# Patient Record
Sex: Male | Born: 1975 | Hispanic: No | Marital: Married | Smoking: Never smoker
Health system: Southern US, Community
[De-identification: ages and names within clinical notes are randomized; demographics above are authoritative.]

## PROBLEM LIST (undated history)

## (undated) DIAGNOSIS — I8390 Asymptomatic varicose veins of unspecified lower extremity: Secondary | ICD-10-CM

---

## 2019-07-08 ENCOUNTER — Inpatient Hospital Stay
Admission: EM | Admit: 2019-07-08 | Discharge: 2019-07-10 | DRG: 177 | Disposition: A | Discharge status: Other Acute Inpatient Hospital | Payer: HRSA Program | Attending: Internal Medicine | Admitting: Internal Medicine

## 2019-07-08 ENCOUNTER — Encounter: Payer: Self-pay | Admitting: Emergency Medicine

## 2019-07-08 ENCOUNTER — Other Ambulatory Visit: Payer: Self-pay

## 2019-07-08 ENCOUNTER — Emergency Department: Payer: HRSA Program

## 2019-07-08 DIAGNOSIS — J9601 Acute respiratory failure with hypoxia: Secondary | ICD-10-CM | POA: Diagnosis present

## 2019-07-08 DIAGNOSIS — F17200 Nicotine dependence, unspecified, uncomplicated: Secondary | ICD-10-CM | POA: Diagnosis present

## 2019-07-08 DIAGNOSIS — J069 Acute upper respiratory infection, unspecified: Secondary | ICD-10-CM

## 2019-07-08 DIAGNOSIS — R112 Nausea with vomiting, unspecified: Secondary | ICD-10-CM

## 2019-07-08 DIAGNOSIS — A419 Sepsis, unspecified organism: Secondary | ICD-10-CM

## 2019-07-08 DIAGNOSIS — E876 Hypokalemia: Secondary | ICD-10-CM | POA: Diagnosis present

## 2019-07-08 DIAGNOSIS — U071 COVID-19: Secondary | ICD-10-CM | POA: Diagnosis present

## 2019-07-08 DIAGNOSIS — J1282 Pneumonia due to coronavirus disease 2019: Secondary | ICD-10-CM

## 2019-07-08 DIAGNOSIS — J1289 Other viral pneumonia: Secondary | ICD-10-CM | POA: Diagnosis present

## 2019-07-08 DIAGNOSIS — Z833 Family history of diabetes mellitus: Secondary | ICD-10-CM

## 2019-07-08 HISTORY — DX: Asymptomatic varicose veins of unspecified lower extremity: I83.90

## 2019-07-08 LAB — COMPREHENSIVE METABOLIC PANEL
ALT: 52 U/L — ABNORMAL HIGH (ref 0–44)
AST: 52 U/L — ABNORMAL HIGH (ref 15–41)
Albumin: 3.3 g/dL — ABNORMAL LOW (ref 3.5–5.0)
Alkaline Phosphatase: 48 U/L (ref 38–126)
Anion gap: 9 (ref 5–15)
BUN: 11 mg/dL (ref 6–20)
CO2: 23 mmol/L (ref 22–32)
Calcium: 8.2 mg/dL — ABNORMAL LOW (ref 8.9–10.3)
Chloride: 105 mmol/L (ref 98–111)
Creatinine, Ser: 0.77 mg/dL (ref 0.61–1.24)
GFR calc Af Amer: >60 mL/min (ref >60)
GFR calc non Af Amer: >60 mL/min (ref >60)
Glucose, Bld: 136 mg/dL — ABNORMAL HIGH (ref 70–99)
Potassium: 3.4 mmol/L — ABNORMAL LOW (ref 3.5–5.1)
Sodium: 137 mmol/L (ref 135–145)
Total Bilirubin: 0.7 mg/dL (ref 0.3–1.2)
Total Protein: 7.7 g/dL (ref 6.5–8.1)

## 2019-07-08 LAB — BRAIN NATRIURETIC PEPTIDE: B Natriuretic Peptide: 25 pg/mL (ref 0.0–100.0)

## 2019-07-08 LAB — CBC WITH DIFFERENTIAL/PLATELET
Abs Immature Granulocytes: 0.03 10*3/uL (ref 0.00–0.07)
Basophils Absolute: 0 10*3/uL (ref 0.0–0.1)
Basophils Relative: 0 %
Eosinophils Absolute: 0 10*3/uL (ref 0.0–0.5)
Eosinophils Relative: 0 %
HCT: 43.1 % (ref 39.0–52.0)
Hemoglobin: 14.2 g/dL (ref 13.0–17.0)
Immature Granulocytes: 1 %
Lymphocytes Relative: 23 %
Lymphs Abs: 1.4 10*3/uL (ref 0.7–4.0)
MCH: 27.6 pg (ref 26.0–34.0)
MCHC: 32.9 g/dL (ref 30.0–36.0)
MCV: 83.9 fL (ref 80.0–100.0)
Monocytes Absolute: 0.3 10*3/uL (ref 0.1–1.0)
Monocytes Relative: 5 %
Neutro Abs: 4.4 10*3/uL (ref 1.7–7.7)
Neutrophils Relative %: 71 %
Platelets: 184 10*3/uL (ref 150–400)
RBC: 5.14 MIL/uL (ref 4.22–5.81)
RDW: 14.3 % (ref 11.5–15.5)
WBC: 6.1 10*3/uL (ref 4.0–10.5)
nRBC: 0 % (ref 0.0–0.2)

## 2019-07-08 LAB — TROPONIN I (HIGH SENSITIVITY): Troponin I (High Sensitivity): 6 ng/L (ref <18)

## 2019-07-08 LAB — PROCALCITONIN: Procalcitonin: <0.1 ng/mL

## 2019-07-08 LAB — FIBRINOGEN: Fibrinogen: 554 mg/dL — ABNORMAL HIGH (ref 210–475)

## 2019-07-08 LAB — LACTIC ACID, PLASMA: Lactic Acid, Venous: 1.4 mmol/L (ref 0.5–1.9)

## 2019-07-08 LAB — TYPE AND SCREEN
ABO/RH(D): O POS
Antibody Screen: NEGATIVE

## 2019-07-08 LAB — LIPASE, BLOOD: Lipase: 38 U/L (ref 11–51)

## 2019-07-08 LAB — ABO/RH: ABO/RH(D): O POS

## 2019-07-08 LAB — C-REACTIVE PROTEIN: CRP: 7.7 mg/dL — ABNORMAL HIGH (ref <1.0)

## 2019-07-08 LAB — MAGNESIUM: Magnesium: 2.1 mg/dL (ref 1.7–2.4)

## 2019-07-08 LAB — FIBRIN DERIVATIVES D-DIMER (ARMC ONLY): Fibrin derivatives D-dimer (ARMC): 1575.3 ng/mL (FEU) — ABNORMAL HIGH (ref 0.00–499.00)

## 2019-07-08 LAB — FERRITIN: Ferritin: 558 ng/mL — ABNORMAL HIGH (ref 24–336)

## 2019-07-08 LAB — LACTATE DEHYDROGENASE: LDH: 285 U/L — ABNORMAL HIGH (ref 98–192)

## 2019-07-08 MED ORDER — GUAIFENESIN ER 600 MG PO TB12
600.0000 mg | ORAL_TABLET | Freq: Two times a day (BID) | ORAL | Status: DC
  Administered 2019-07-08 – 2019-07-10 (×4): 600 mg via ORAL
  Filled 2019-07-08 (×4): qty 1

## 2019-07-08 MED ORDER — VITAMIN C 500 MG PO TABS
500.0000 mg | ORAL_TABLET | Freq: Every day | ORAL | Status: DC
  Administered 2019-07-08 – 2019-07-10 (×3): 500 mg via ORAL
  Filled 2019-07-08 (×3): qty 1

## 2019-07-08 MED ORDER — DEXAMETHASONE SODIUM PHOSPHATE 10 MG/ML IJ SOLN
10.0000 mg | Freq: Once | INTRAMUSCULAR | Status: AC
  Administered 2019-07-08: 10 mg via INTRAVENOUS
  Filled 2019-07-08: qty 1

## 2019-07-08 MED ORDER — DM-GUAIFENESIN ER 30-600 MG PO TB12: 1.0000 | ORAL_TABLET | Freq: Two times a day (BID) | ORAL | Status: DC

## 2019-07-08 MED ORDER — ONDANSETRON HCL 4 MG/2ML IJ SOLN: 4.0000 mg | Freq: Four times a day (QID) | INTRAMUSCULAR | Status: DC | PRN

## 2019-07-08 MED ORDER — SODIUM CHLORIDE 0.9 % IV SOLN
100.0000 mg | Freq: Every day | INTRAVENOUS | Status: DC
  Administered 2019-07-09 – 2019-07-10 (×2): 100 mg via INTRAVENOUS
  Filled 2019-07-08: qty 20
  Filled 2019-07-08 (×2): qty 100

## 2019-07-08 MED ORDER — ALBUTEROL SULFATE HFA 108 (90 BASE) MCG/ACT IN AERS
2.0000 | INHALATION_SPRAY | Freq: Four times a day (QID) | RESPIRATORY_TRACT | Status: DC | PRN
  Filled 2019-07-08: qty 6.7

## 2019-07-08 MED ORDER — LACTATED RINGERS IV BOLUS
1000.0000 mL | Freq: Once | INTRAVENOUS | Status: AC
  Administered 2019-07-08: 1000 mL via INTRAVENOUS

## 2019-07-08 MED ORDER — METHYLPREDNISOLONE SODIUM SUCC 40 MG IJ SOLR
40.0000 mg | Freq: Two times a day (BID) | INTRAMUSCULAR | Status: DC
  Administered 2019-07-09 – 2019-07-10 (×3): 40 mg via INTRAVENOUS
  Filled 2019-07-08 (×4): qty 1

## 2019-07-08 MED ORDER — SODIUM CHLORIDE 0.9 % IV SOLN
200.0000 mg | Freq: Once | INTRAVENOUS | Status: AC
  Administered 2019-07-08: 200 mg via INTRAVENOUS
  Filled 2019-07-08: qty 200

## 2019-07-08 MED ORDER — INFLUENZA VAC SPLIT QUAD 0.5 ML IM SUSY: 0.5000 mL | PREFILLED_SYRINGE | INTRAMUSCULAR | Status: DC

## 2019-07-08 MED ORDER — ACETAMINOPHEN 325 MG PO TABS: 650.0000 mg | ORAL_TABLET | Freq: Four times a day (QID) | ORAL | Status: DC | PRN

## 2019-07-08 MED ORDER — SODIUM CHLORIDE 0.9 % IV SOLN
INTRAVENOUS | Status: DC
  Administered 2019-07-08 – 2019-07-09 (×3): via INTRAVENOUS

## 2019-07-08 MED ORDER — ACETAMINOPHEN 500 MG PO TABS
1000.0000 mg | ORAL_TABLET | Freq: Once | ORAL | Status: AC
  Administered 2019-07-08: 1000 mg via ORAL
  Filled 2019-07-08: qty 2

## 2019-07-08 MED ORDER — ONDANSETRON HCL 4 MG/2ML IJ SOLN
4.0000 mg | Freq: Once | INTRAMUSCULAR | Status: AC
  Administered 2019-07-08: 4 mg via INTRAVENOUS
  Filled 2019-07-08: qty 2

## 2019-07-08 MED ORDER — POTASSIUM CHLORIDE CRYS ER 20 MEQ PO TBCR
20.0000 meq | EXTENDED_RELEASE_TABLET | Freq: Once | ORAL | Status: AC
  Administered 2019-07-08: 20 meq via ORAL
  Filled 2019-07-08: qty 1

## 2019-07-08 MED ORDER — DEXTROMETHORPHAN POLISTIREX ER 30 MG/5ML PO SUER
30.0000 mg | Freq: Two times a day (BID) | ORAL | Status: DC
  Administered 2019-07-08 – 2019-07-10 (×4): 30 mg via ORAL
  Filled 2019-07-08 (×6): qty 5

## 2019-07-08 MED ORDER — LEVALBUTEROL TARTRATE 45 MCG/ACT IN AERO
2.0000 | INHALATION_SPRAY | Freq: Four times a day (QID) | RESPIRATORY_TRACT | Status: DC | PRN
  Filled 2019-07-08 (×2): qty 15

## 2019-07-08 MED ORDER — ZINC SULFATE 220 (50 ZN) MG PO CAPS
220.0000 mg | ORAL_CAPSULE | Freq: Every day | ORAL | Status: DC
  Administered 2019-07-08 – 2019-07-10 (×3): 220 mg via ORAL
  Filled 2019-07-08 (×3): qty 1

## 2019-07-08 MED ORDER — ENOXAPARIN SODIUM 40 MG/0.4ML ~~LOC~~ SOLN
40.0000 mg | SUBCUTANEOUS | Status: DC
  Administered 2019-07-08 – 2019-07-09 (×2): 40 mg via SUBCUTANEOUS
  Filled 2019-07-08 (×2): qty 0.4

## 2019-07-08 MED ORDER — ONDANSETRON HCL 4 MG PO TABS: 4.0000 mg | ORAL_TABLET | Freq: Four times a day (QID) | ORAL | Status: DC | PRN

## 2019-07-08 MED ORDER — ALBUTEROL SULFATE HFA 108 (90 BASE) MCG/ACT IN AERS
2.0000 | INHALATION_SPRAY | RESPIRATORY_TRACT | Status: DC | PRN
  Administered 2019-07-08: 2 via RESPIRATORY_TRACT
  Filled 2019-07-08: qty 6.7

## 2019-07-08 MED ORDER — OXYCODONE-ACETAMINOPHEN 5-325 MG PO TABS
1.0000 | ORAL_TABLET | ORAL | Status: DC | PRN
  Administered 2019-07-08 – 2019-07-10 (×3): 1 via ORAL
  Filled 2019-07-08 (×4): qty 1

## 2019-07-08 NOTE — Progress Notes (Signed)
   07/08/19 1945  Clinical Encounter Type  Visited With Patient  Visit Type Initial  Referral From Nurse  Consult/Referral To Chaplain  Spiritual Encounters  Spiritual Needs Other (Comment)  [place] called patient due to patient being under Covid-19 precautions. Pt declined pastoral care. No further needs at this time.

## 2019-07-08 NOTE — H&P (Signed)
History and Physical    Jacob Gardner TDV:[id number] DOB: 11/21/1975 DOA: 07/08/2019  Referring MD/NP/PA:   PCP: Patient, No Pcp Per   Patient coming from:  The patient is coming from home.  At baseline, pt is independent for most of ADL.        Chief Complaint: SOB  HPI: Jacob Gardner is a 43 y.o. male without significant medical history who present with SOB.  Patient reports that he initially started feeling bad about 1 week ago with fevers, nausea, cough, chest pain, and shortness of breath.  He subsequently tested positive for COVID-19 3 days ago at the [facility] emergency room.  He states his symptoms have progressively worsened since then with increasing difficulty breathing as well as ongoing central sharp pleuritic chest pain. He also has fever and chills. He also states that he has been having nausea, vomiting, poor appetite in the past several days. He denies any associated abdominal pain, constipation, or diarrhea.  ED Course: pt was found to have WBC 6.1, trop 6, lactic acid 1.4, potassium 3.4, creatinine normal, temperature 101, tachycardia, tachypnea, oxygen saturation 88% on room air, which improved to 95% on 4 L oxygen.  Checks x-ray showed ground glass infiltration bilaterally.  Patient is admitted to [place] bed as inpatient.  Review of Systems:   General: has fevers, chills, no body weight gain, has poor appetite, has fatigue HEENT: no blurry vision, hearing changes or sore throat Respiratory: has dyspnea, coughing, no wheezing CV: no chest pain, no palpitations GI: has nausea, vomiting, no abdominal pain, diarrhea, constipation GU: no dysuria, burning on urination, increased urinary frequency, hematuria  Ext: no leg edema Neuro: no unilateral weakness, numbness, or tingling, no vision change or hearing loss Skin: no rash, no skin tear. MSK: No muscle spasm, no deformity, no limitation of range of movement in spin Heme: No easy bruising.  Travel history: No recent  long distant travel.  Allergy: No Known Allergies  Past Medical History:  Diagnosis Date  . Varicose vein of leg     History reviewed. No pertinent surgical history.  Social History:  reports that he has been smoking. He uses smokeless tobacco. He reports that he does not drink alcohol or use drugs.  Family History:  Family History  Problem Relation Age of Onset  . Diabetes Mellitus II Mother      Prior to Admission medications   Not on File    Physical Exam: Vitals:   07/08/19 1530 07/08/19 1600 07/08/19 1700 07/08/19 1702  BP:  104/66  104/67  Pulse: 86 80  73  Resp: (!) 24 (!) 21  19  Temp:    98.7 F (37.1 C)  TempSrc:    Oral  SpO2: 96% 95%  95%  Weight:   114.6 kg   Height:   5\' 5"  (1.651 m)    General: Not in acute distress HEENT:       Eyes: PERRL, EOMI, no scleral icterus.       ENT: No discharge from the ears and nose, no pharynx injection, no tonsillar enlargement.        Neck: No JVD, no bruit, no mass felt. Heme: No neck lymph node enlargement. Cardiac: S1/S2, RRR, No murmurs, No gallops or rubs. Respiratory: o rales, wheezing, rhonchi or rubs. GI: Soft, nondistended, nontender, no rebound pain, no organomegaly, BS present. GU: No hematuria Ext: No pitting leg edema bilaterally. 2+DP/PT pulse bilaterally. Musculoskeletal: No joint deformities, No joint redness or warmth, no limitation  of ROM in spin. Skin: No rashes.  Neuro: Alert, oriented X3, cranial nerves II-XII grossly intact, moves all extremities normally.  Psych: Patient is not psychotic, no suicidal or hemocidal ideation.  Labs on Admission: I have personally reviewed following labs and imaging studies  CBC: Recent Labs  Lab 07/08/19 1146  WBC 6.1  NEUTROABS 4.4  HGB 14.2  HCT 43.1  MCV 83.9  PLT 184   Basic Metabolic Panel: Recent Labs  Lab 07/08/19 1146  NA 137  K 3.4*  CL 105  CO2 23  GLUCOSE 136*  BUN 11  CREATININE 0.77  CALCIUM 8.2*  MG 2.1   GFR: Estimated  Creatinine Clearance: 140.7 mL/min (by C-G formula based on SCr of 0.77 mg/dL). Liver Function Tests: Recent Labs  Lab 07/08/19 1146  AST 52*  ALT 52*  ALKPHOS 48  BILITOT 0.7  PROT 7.7  ALBUMIN 3.3*   Recent Labs  Lab 07/08/19 1146  LIPASE 38   No results for input(s): AMMONIA in the last 168 hours. Coagulation Profile: No results for input(s): INR, PROTIME in the last 168 hours. Cardiac Enzymes: No results for input(s): CKTOTAL, CKMB, CKMBINDEX, TROPONINI in the last 168 hours. BNP (last 3 results) No results for input(s): PROBNP in the last 8760 hours. HbA1C: No results for input(s): HGBA1C in the last 72 hours. CBG: No results for input(s): GLUCAP in the last 168 hours. Lipid Profile: No results for input(s): CHOL, HDL, LDLCALC, TRIG, CHOLHDL, LDLDIRECT in the last 72 hours. Thyroid Function Tests: No results for input(s): TSH, T4TOTAL, FREET4, T3FREE, THYROIDAB in the last 72 hours. Anemia Panel: Recent Labs    07/08/19 1146  FERRITIN 558*   Urine analysis: No results found for: COLORURINE, APPEARANCEUR, LABSPEC, PHURINE, GLUCOSEU, HGBUR, BILIRUBINUR, KETONESUR, PROTEINUR, UROBILINOGEN, NITRITE, LEUKOCYTESUR Sepsis Labs: @LABRCNTIP (procalcitonin:4,lacticidven:4) )No results found for this or any previous visit (from the past 240 hour(s)).   Radiological Exams on Admission: Dg Chest Portable 1 View  Result Date: 07/08/2019 CLINICAL DATA:  Increasing shortness of breath positive COVID test last Wednesday EXAM: PORTABLE CHEST 1 VIEW COMPARISON:  None FINDINGS: Cardiomediastinal contours are normal. Lungs with nodular and ground-glass opacities with basilar predominance. No signs of dense consolidation or evidence of pleural effusion. IMPRESSION: Findings of atypical or viral pneumonia, could certainly be seen in the setting of COVID-19 infection. No dense consolidation or pleural effusion. Electronically Signed   By: Donzetta KohutGeoffrey  Wile M.D.   On: 07/08/2019 13:12      EKG: Independently reviewed.  Sinus rhythm, QTC 425, early R wave progression  Assessment/Plan Principal Problem:   Acute respiratory disease due to COVID-19 virus Active Problems:   Hypokalemia   Acute respiratory failure with hypoxia (HCC)   Acute respiratory failure with hypoxia due to acute respiratory disease due to COVID-19 virus: -will admit to med-surg  as inpt -Remdesivir per pharm -Solumedrol 40 mg bid -vitamin C, zinc.   -prn albuterol inhaler -PRN Mucinex for cough -f/u Blood culture -Gentle IV fluid:  -D-dimer, BNP,Trop, LFT, CRP, LDH, Procalcitonin, ferritin, fibinogen, TG, Hep B SAg, HIV ab -Daily CRP, Ferritin, D-dimer, -Will ask the patient to maintain an awake prone position for 16+ hours a day, if possible, with a minimum of 2-3 hours at a time -Will attempt to maintain euvolemia to a net negative fluid status -IF patient deteriorates, will consult PCCM and ID   Hypokalemia: K= 3.4 on admission. - Repleted - Check Mg level    Inpatient status:  # Patient requires inpatient status due  to high intensity of service, high risk for further deterioration and high frequency of surveillance required.  I certify that at the point of admission it is my clinical judgment that the patient will require inpatient hospital care spanning beyond 2 midnights from the point of admission.  . Now patient has presenting with Acute respiratory disease due to COVID-19 virus and acute respiratory failure with hypoxia . The initial radiographic and laboratory data are worrisome because of bilateral groundglass infiltration on chest x-ray . Current medical needs: please see my assessment and plan . Predictability of an adverse outcome (risk): Patient presents with acute respiratory failure with hypoxia due to acute respiratory disease secondary to COVID-19 virus infection.  His presentation is highly complicated. Pt is at high risk for deteriorating.  Need to be treated in hospital  for at least 2 days.    DVT ppx: SQ Lovenox Code Status: Full code Family Communication: None at bed side.      Disposition Plan:  Anticipate discharge back to previous home environment Consults called:  none Admission status: med-surg as inpt    Date of Service 07/08/2019    Lorretta Harp [facility]   If 7PM-7AM, please contact night-coverage [email or web address] Password [email or web address] 07/08/2019, 7:21 PM

## 2019-07-08 NOTE — Treatment Plan (Signed)
Called by Dr. Charna Archer regarding Jacob Gardner.  COVID pneumonia currently requiring 4 L.  [facility] currently accepting transfers only for patients requiring more than 4 L, so at this time, he'll need to be admitted at [facility].  Please let us know if his O2 needs increase.

## 2019-07-08 NOTE — ED Triage Notes (Signed)
Pt arrived from home via [facility]. Pt c/o increasing SOB, pt received positive COVID test this past Wednesday. Pt arrived on room air.

## 2019-07-08 NOTE — ED Provider Notes (Signed)
[facility] Emergency Department Provider Note   ____________________________________________   First MD Initiated Contact with Patient 07/08/19 1125     (approximate)  I have reviewed the triage vital signs and the nursing notes.   HISTORY  Chief Complaint Shortness of Breath    HPI Jacob Gardner is a 43 y.o. male with no significant past medical history who presents to the ED complaining of shortness of breath and vomiting.  Patient reports that he initially started feeling bad about 1 week ago with fevers, nausea, cough, chest pain, and shortness of breath.  He subsequently tested positive for COVID-19 3 days ago at the [facility] emergency room.  He states his symptoms have progressively worsened since then with increasing difficulty breathing as well as ongoing central sharp pleuritic chest pain.  He also states that he has been vomiting for the past few days, has been unable to keep down liquids or solids.  He denies any associated abdominal pain, constipation, or diarrhea.        Past Medical History:  Diagnosis Date  . Varicose vein of leg     Patient Active Problem List   Diagnosis Date Noted  . Acute respiratory disease due to COVID-19 virus 07/08/2019  . Sepsis ([place]) 07/08/2019  . Hypokalemia 07/08/2019    History reviewed. No pertinent surgical history.  Prior to Admission medications   Not on File    Allergies Patient has no allergy information on record.  No family history on file.  Social History Social History   Tobacco Use  . Smoking status: Not on file  Substance Use Topics  . Alcohol use: Not on file  . Drug use: Not on file    Review of Systems  Constitutional: No for fever/chills Eyes: No visual changes. ENT: No sore throat. Cardiovascular: Positive for chest pain. Respiratory: Positive for cough and shortness of breath. Gastrointestinal: No abdominal pain.  Positive for nausea and vomiting.  No  diarrhea.  No constipation. Genitourinary: Negative for dysuria. Musculoskeletal: Negative for back pain. Skin: Negative for rash. Neurological: Negative for headaches, focal weakness or numbness.  ____________________________________________   PHYSICAL EXAM:  VITAL SIGNS: ED Triage Vitals [07/08/19 1123]  Enc Vitals Group     BP      Pulse      Resp      Temp      Temp src      SpO2 (!) 89 %     Weight      Height      Head Circumference      Peak Flow      Pain Score      Pain Loc      Pain Edu?      Excl. in [place]?     Constitutional: Alert and oriented. Eyes: Conjunctivae are normal. Head: Atraumatic. Nose: No congestion/rhinnorhea. Mouth/Throat: Mucous membranes are moist. Neck: Normal ROM Cardiovascular: Normal rate, regular rhythm. Grossly normal heart sounds. Respiratory: Tachypneic and in moderate respiratory distress.  No retractions.  Diffuse crackles throughout. Gastrointestinal: Soft and nontender. No distention. Genitourinary: deferred Musculoskeletal: No lower extremity tenderness nor edema. Neurologic:  Normal speech and language. No gross focal neurologic deficits are appreciated. Skin:  Skin is warm, dry and intact. No rash noted. Psychiatric: Mood and affect are normal. Speech and behavior are normal.  ____________________________________________   LABS (all labs ordered are listed, but only abnormal results are displayed)  Labs Reviewed  COMPREHENSIVE METABOLIC PANEL - Abnormal; Notable for  the following components:      Result Value   Potassium 3.4 (*)    Glucose, Bld 136 (*)    Calcium 8.2 (*)    Albumin 3.3 (*)    AST 52 (*)    ALT 52 (*)    All other components within normal limits  CBC WITH DIFFERENTIAL/PLATELET  LIPASE, BLOOD  TROPONIN I (HIGH SENSITIVITY)   ____________________________________________  EKG  ED ECG REPORT I, Chesley Noon, the attending physician, personally viewed and interpreted this ECG.   Date:  07/08/2019  EKG Time: 14:28  Rate: 88  Rhythm: normal sinus rhythm  Axis: Normal  Intervals:none  ST&T Change: None   PROCEDURES  Procedure(s) performed (including Critical Care):  .Critical Care Performed by: Chesley Noon, MD Authorized by: Chesley Noon, MD   Critical care provider statement:    Critical care time (minutes):  45   Critical care time was exclusive of:  Separately billable procedures and treating other patients and teaching time   Critical care was necessary to treat or prevent imminent or life-threatening deterioration of the following conditions:  Respiratory failure   Critical care was time spent personally by me on the following activities:  Discussions with consultants, evaluation of patient's response to treatment, examination of patient, ordering and performing treatments and interventions, ordering and review of laboratory studies, ordering and review of radiographic studies, pulse oximetry, re-evaluation of patient's condition, obtaining history from patient or surrogate and review of old charts   I assumed direction of critical care for this patient from another provider in my specialty: no       ____________________________________________   INITIAL IMPRESSION / ASSESSMENT AND PLAN / ED COURSE       43 year old male with recent diagnosis of COVID-19 3 days prior presents to the ED with worsening shortness of breath as well as vomiting with inability to tolerate p.o.  He is in moderate respiratory distress upon arrival, noted to be tachypneic with increased work of breathing.  He was also hypoxic in the 80s on room air, subsequently placed on 4 L nasal cannula with improvement.  Suspect his breathing difficulties are secondary to COVID-19, will give dose of steroids.  He has had difficulty tolerating p.o. but has no focal tenderness on his abdominal exam.  Will hydrate with IV fluids and give dose of Zofran, labs pending.  Anticipate transfer to [facility] for further management of COVID-19.  Chest x-ray appears consistent with COVID-19.  Patient is feeling better following Zofran, fluid bolus, and steroids.  The remainder of his labs are unremarkable, EKG without acute ischemic changes.  Case was discussed with Dr. Lowell Guitar at [facility], who states that space is only available for patients requiring greater than 4 L by nasal cannula.  Case discussed with hospitalist here at [facility], who accepted patient for admission.      ____________________________________________   FINAL CLINICAL IMPRESSION(S) / ED DIAGNOSES  Final diagnoses:  Pneumonia due to COVID-19 virus  Acute respiratory failure with hypoxia (HCC)  Non-intractable vomiting with nausea, unspecified vomiting type     ED Discharge Orders    None       Note:  This document was prepared using Dragon voice recognition software and may include unintentional dictation errors.   Chesley Noon, MD 07/08/19 1436

## 2019-07-08 NOTE — Progress Notes (Signed)
Remdesivir - Pharmacy Brief Note   O:  ALT: 52 CXR: atypical vs viral pneumonia SpO2: 88% on RA   A/P:  Remdesivir 200 mg IVPB once followed by 100 mg IVPB daily x 4 days.   .me 07/08/2019 2:21 PM

## 2019-07-08 NOTE — Plan of Care (Signed)
Pt admitted to in-patient status for symptomatic COVID-19 +.  Receiving oxygen 4L/min via [place] with SpO2 95%.   Problem: Education: Goal: Knowledge of General Education information will improve Description: Including pain rating scale, medication(s)/side effects and non-pharmacologic comfort measures Outcome: Progressing   Problem: Health Behavior/Discharge Planning: Goal: Ability to manage health-related needs will improve Outcome: Progressing   Problem: Clinical Measurements: Goal: Ability to maintain clinical measurements within normal limits will improve Outcome: Progressing Goal: Will remain free from infection Outcome: Progressing Goal: Diagnostic test results will improve Outcome: Progressing Goal: Respiratory complications will improve Outcome: Progressing Goal: Cardiovascular complication will be avoided Outcome: Progressing   Problem: Activity: Goal: Risk for activity intolerance will decrease Outcome: Progressing   Problem: Nutrition: Goal: Adequate nutrition will be maintained Outcome: Progressing   Problem: Coping: Goal: Level of anxiety will decrease Outcome: Progressing   Problem: Elimination: Goal: Will not experience complications related to bowel motility Outcome: Progressing Goal: Will not experience complications related to urinary retention Outcome: Progressing   Problem: Pain Managment: Goal: General experience of comfort will improve Outcome: Progressing   Problem: Safety: Goal: Ability to remain free from injury will improve Outcome: Progressing   Problem: Skin Integrity: Goal: Risk for impaired skin integrity will decrease Outcome: Progressing   Problem: Education: Goal: Knowledge of risk factors and measures for prevention of condition will improve Outcome: Progressing   Problem: Coping: Goal: Psychosocial and spiritual needs will be supported Outcome: Progressing   Problem: Respiratory: Goal: Will maintain a patent  airway Outcome: Progressing Goal: Complications related to the disease process, condition or treatment will be avoided or minimized Outcome: Progressing

## 2019-07-09 DIAGNOSIS — J9601 Acute respiratory failure with hypoxia: Secondary | ICD-10-CM

## 2019-07-09 LAB — CBC WITH DIFFERENTIAL/PLATELET
Abs Immature Granulocytes: 0.01 10*3/uL (ref 0.00–0.07)
Basophils Absolute: 0 10*3/uL (ref 0.0–0.1)
Basophils Relative: 0 %
Eosinophils Absolute: 0 10*3/uL (ref 0.0–0.5)
Eosinophils Relative: 0 %
HCT: 39.4 % (ref 39.0–52.0)
Hemoglobin: 13.3 g/dL (ref 13.0–17.0)
Immature Granulocytes: 0 %
Lymphocytes Relative: 20 %
Lymphs Abs: 0.8 10*3/uL (ref 0.7–4.0)
MCH: 27.4 pg (ref 26.0–34.0)
MCHC: 33.8 g/dL (ref 30.0–36.0)
MCV: 81.1 fL (ref 80.0–100.0)
Monocytes Absolute: 0.1 10*3/uL (ref 0.1–1.0)
Monocytes Relative: 3 %
Neutro Abs: 3 10*3/uL (ref 1.7–7.7)
Neutrophils Relative %: 77 %
Platelets: 184 10*3/uL (ref 150–400)
RBC: 4.86 MIL/uL (ref 4.22–5.81)
RDW: 14.3 % (ref 11.5–15.5)
Smear Review: NORMAL
WBC Morphology: ABNORMAL
WBC: 3.9 10*3/uL — ABNORMAL LOW (ref 4.0–10.5)
nRBC: 0 % (ref 0.0–0.2)

## 2019-07-09 LAB — COMPREHENSIVE METABOLIC PANEL
ALT: 55 U/L — ABNORMAL HIGH (ref 0–44)
AST: 45 U/L — ABNORMAL HIGH (ref 15–41)
Albumin: 2.9 g/dL — ABNORMAL LOW (ref 3.5–5.0)
Alkaline Phosphatase: 44 U/L (ref 38–126)
Anion gap: 8 (ref 5–15)
BUN: 10 mg/dL (ref 6–20)
CO2: 22 mmol/L (ref 22–32)
Calcium: 8.1 mg/dL — ABNORMAL LOW (ref 8.9–10.3)
Chloride: 108 mmol/L (ref 98–111)
Creatinine, Ser: 0.53 mg/dL — ABNORMAL LOW (ref 0.61–1.24)
GFR calc Af Amer: >60 mL/min (ref >60)
GFR calc non Af Amer: >60 mL/min (ref >60)
Glucose, Bld: 184 mg/dL — ABNORMAL HIGH (ref 70–99)
Potassium: 3.9 mmol/L (ref 3.5–5.1)
Sodium: 138 mmol/L (ref 135–145)
Total Bilirubin: 0.6 mg/dL (ref 0.3–1.2)
Total Protein: 6.8 g/dL (ref 6.5–8.1)

## 2019-07-09 LAB — FERRITIN: Ferritin: 455 ng/mL — ABNORMAL HIGH (ref 24–336)

## 2019-07-09 LAB — HEPATITIS B SURFACE ANTIGEN: Hepatitis B Surface Ag: NONREACTIVE

## 2019-07-09 LAB — FIBRIN DERIVATIVES D-DIMER (ARMC ONLY): Fibrin derivatives D-dimer (ARMC): 1144.08 ng/mL (FEU) — ABNORMAL HIGH (ref 0.00–499.00)

## 2019-07-09 LAB — HIV ANTIBODY (ROUTINE TESTING W REFLEX): HIV Screen 4th Generation wRfx: NONREACTIVE

## 2019-07-09 LAB — C-REACTIVE PROTEIN: CRP: 7.7 mg/dL — ABNORMAL HIGH (ref <1.0)

## 2019-07-09 MED ORDER — ACETAMINOPHEN 325 MG PO TABS: 650.0000 mg | ORAL_TABLET | Freq: Four times a day (QID) | ORAL | Status: DC | PRN

## 2019-07-09 MED ORDER — MORPHINE SULFATE (PF) 2 MG/ML IV SOLN
2.0000 mg | INTRAVENOUS | Status: DC | PRN
  Administered 2019-07-09: 2 mg via INTRAVENOUS
  Filled 2019-07-09: qty 1

## 2019-07-09 NOTE — Plan of Care (Signed)

## 2019-07-09 NOTE — Progress Notes (Signed)
He's on wait list for [facility]

## 2019-07-09 NOTE — Plan of Care (Signed)
?  Problem: Clinical Measurements: ?Goal: Ability to maintain clinical measurements within normal limits will improve ?Outcome: Progressing ?  ?Problem: Clinical Measurements: ?Goal: Will remain free from infection ?Outcome: Progressing ?  ?Problem: Clinical Measurements: ?Goal: Respiratory complications will improve ?Outcome: Progressing ?  ?

## 2019-07-09 NOTE — Progress Notes (Signed)
[facility]   PATIENT NAME: Jacob Gardner    MR#:  [id number]  DATE OF BIRTH:  July 20, 1976  SUBJECTIVE:  CHIEF COMPLAINT:   Chief Complaint  Patient presents with  . Shortness of Breath  needs 4 liters O2, SOB +, cough + REVIEW OF SYSTEMS:  Review of Systems  Constitutional: Negative for diaphoresis, fever, malaise/fatigue and weight loss.  HENT: Negative for ear discharge, ear pain, hearing loss, nosebleeds, sore throat and tinnitus.   Eyes: Negative for blurred vision and pain.  Respiratory: Positive for cough and shortness of breath. Negative for hemoptysis and wheezing.   Cardiovascular: Negative for chest pain, palpitations, orthopnea and leg swelling.  Gastrointestinal: Negative for abdominal pain, blood in stool, constipation, diarrhea, heartburn, nausea and vomiting.  Genitourinary: Negative for dysuria, frequency and urgency.  Musculoskeletal: Negative for back pain and myalgias.  Skin: Negative for itching and rash.  Neurological: Negative for dizziness, tingling, tremors, focal weakness, seizures, weakness and headaches.  Psychiatric/Behavioral: Negative for depression. The patient is not nervous/anxious.     DRUG ALLERGIES:  No Known Allergies VITALS:  Blood pressure 104/65, pulse 71, temperature 98.4 F (36.9 C), temperature source Oral, resp. rate 18, height 5\' 5"  (1.651 m), weight 114.6 kg, SpO2 95 %. PHYSICAL EXAMINATION:  Physical Exam HENT:     Head: Normocephalic and atraumatic.  Eyes:     Conjunctiva/sclera: Conjunctivae normal.     Pupils: Pupils are equal, round, and reactive to light.  Neck:     Musculoskeletal: Normal range of motion and neck supple.     Thyroid: No thyromegaly.     Trachea: No tracheal deviation.  Cardiovascular:     Rate and Rhythm: Normal rate and regular rhythm.     Heart sounds: Normal heart sounds.  Pulmonary:     Effort: Pulmonary effort is normal. No respiratory distress.     Breath sounds: Normal  breath sounds. No wheezing.  Chest:     Chest wall: No tenderness.  Abdominal:     General: Bowel sounds are normal. There is no distension.     Palpations: Abdomen is soft.     Tenderness: There is no abdominal tenderness.  Musculoskeletal: Normal range of motion.  Skin:    General: Skin is warm and dry.     Findings: No rash.  Neurological:     Mental Status: He is alert and oriented to person, place, and time.     Cranial Nerves: No cranial nerve deficit.    LABORATORY PANEL:  Male CBC Recent Labs  Lab 07/09/19 0641  WBC 3.9*  HGB 13.3  HCT 39.4  PLT 184   ------------------------------------------------------------------------------------------------------------------ Chemistries  Recent Labs  Lab 07/08/19 1146 07/09/19 0641  NA 137 138  K 3.4* 3.9  CL 105 108  CO2 23 22  GLUCOSE 136* 184*  BUN 11 10  CREATININE 0.77 0.53*  CALCIUM 8.2* 8.1*  MG 2.1  --   AST 52* 45*  ALT 52* 55*  ALKPHOS 48 44  BILITOT 0.7 0.6   RADIOLOGY:  No results found. ASSESSMENT AND PLAN:   Acute respiratory failure with hypoxia due to acute respiratory disease due to COVID-19 virus: -Remdesivir day 2/5 -Solumedrol day 2/10 -vitamin C, zinc.  -prn albuterol inhaler -PRN Mucinex for cough -neg Blood culture -Gentle IV fluid:  -Trend daily D-dimer, LFT, CRP, LDH, ferritin, fibinogen,  - check Procalcitonin, BNP,Trop, TG, Hep B SAg, HIV ab - maintain an awake prone position for 16+ hours a day,  if possible, with a minimum of 2-3 hours at a time -Will attempt to maintain euvolemia to a net negative fluid status -IF patient deteriorates, will consult PCCM and ID  Hypokalemia: repleted and resolved    All the records are reviewed and case discussed with Care Management/Social Worker. Management plans discussed with the patient, nursing and they are in agreement.  CODE STATUS: Full Code  TOTAL TIME TAKING CARE OF THIS PATIENT: 35 minutes.   More than 50% of the time  was spent in counseling/coordination of care: YES  POSSIBLE D/C IN 4 DAYS, DEPENDING ON CLINICAL CONDITION.   Max Sane M.D on 07/09/2019 at 8:16 PM  Between 7am to 6pm - Pager - [phone]  After 6pm go to [email or web address] - password [email or web address]  [facility]   CC: Primary care physician; Patient, No Pcp Per  Note: This dictation was prepared with Dragon dictation along with smaller phrase technology. Any transcriptional errors that result from this process are unintentional.

## 2019-07-10 ENCOUNTER — Inpatient Hospital Stay (HOSPITAL_COMMUNITY)
Admission: AD | Admit: 2019-07-10 | Discharge: 2019-07-12 | DRG: 177 | Disposition: A | Discharge status: Home / Self Care | Payer: HRSA Program | Source: Other Acute Inpatient Hospital | Attending: Internal Medicine | Admitting: Internal Medicine

## 2019-07-10 DIAGNOSIS — E876 Hypokalemia: Secondary | ICD-10-CM | POA: Diagnosis present

## 2019-07-10 DIAGNOSIS — Z6841 Body Mass Index (BMI) 40.0 and over, adult: Secondary | ICD-10-CM | POA: Diagnosis not present

## 2019-07-10 DIAGNOSIS — Z720 Tobacco use: Secondary | ICD-10-CM

## 2019-07-10 DIAGNOSIS — Z833 Family history of diabetes mellitus: Secondary | ICD-10-CM

## 2019-07-10 DIAGNOSIS — Z9981 Dependence on supplemental oxygen: Secondary | ICD-10-CM

## 2019-07-10 DIAGNOSIS — U071 COVID-19: Principal | ICD-10-CM | POA: Diagnosis present

## 2019-07-10 DIAGNOSIS — E669 Obesity, unspecified: Secondary | ICD-10-CM | POA: Diagnosis present

## 2019-07-10 DIAGNOSIS — J9601 Acute respiratory failure with hypoxia: Secondary | ICD-10-CM | POA: Diagnosis present

## 2019-07-10 DIAGNOSIS — J1289 Other viral pneumonia: Secondary | ICD-10-CM | POA: Diagnosis present

## 2019-07-10 LAB — COMPREHENSIVE METABOLIC PANEL
ALT: 53 U/L — ABNORMAL HIGH (ref 0–44)
AST: 33 U/L (ref 15–41)
Albumin: 2.6 g/dL — ABNORMAL LOW (ref 3.5–5.0)
Alkaline Phosphatase: 42 U/L (ref 38–126)
Anion gap: 8 (ref 5–15)
BUN: 11 mg/dL (ref 6–20)
CO2: 24 mmol/L (ref 22–32)
Calcium: 8.2 mg/dL — ABNORMAL LOW (ref 8.9–10.3)
Chloride: 107 mmol/L (ref 98–111)
Creatinine, Ser: 0.56 mg/dL — ABNORMAL LOW (ref 0.61–1.24)
GFR calc Af Amer: >60 mL/min (ref >60)
GFR calc non Af Amer: >60 mL/min (ref >60)
Glucose, Bld: 162 mg/dL — ABNORMAL HIGH (ref 70–99)
Potassium: 3.9 mmol/L (ref 3.5–5.1)
Sodium: 139 mmol/L (ref 135–145)
Total Bilirubin: 0.6 mg/dL (ref 0.3–1.2)
Total Protein: 6.4 g/dL — ABNORMAL LOW (ref 6.5–8.1)

## 2019-07-10 LAB — CBC WITH DIFFERENTIAL/PLATELET
Abs Immature Granulocytes: 0.05 10*3/uL (ref 0.00–0.07)
Basophils Absolute: 0 10*3/uL (ref 0.0–0.1)
Basophils Relative: 0 %
Eosinophils Absolute: 0 10*3/uL (ref 0.0–0.5)
Eosinophils Relative: 0 %
HCT: 40.4 % (ref 39.0–52.0)
Hemoglobin: 13.4 g/dL (ref 13.0–17.0)
Immature Granulocytes: 1 %
Lymphocytes Relative: 12 %
Lymphs Abs: 1.1 10*3/uL (ref 0.7–4.0)
MCH: 27.1 pg (ref 26.0–34.0)
MCHC: 33.2 g/dL (ref 30.0–36.0)
MCV: 81.6 fL (ref 80.0–100.0)
Monocytes Absolute: 0.5 10*3/uL (ref 0.1–1.0)
Monocytes Relative: 5 %
Neutro Abs: 8.1 10*3/uL — ABNORMAL HIGH (ref 1.7–7.7)
Neutrophils Relative %: 82 %
Platelets: 230 10*3/uL (ref 150–400)
RBC: 4.95 MIL/uL (ref 4.22–5.81)
RDW: 14.3 % (ref 11.5–15.5)
Smear Review: NORMAL
WBC: 9.8 10*3/uL (ref 4.0–10.5)
nRBC: 0 % (ref 0.0–0.2)

## 2019-07-10 LAB — CBC
HCT: 45.5 % (ref 39.0–52.0)
Hemoglobin: 14.5 g/dL (ref 13.0–17.0)
MCH: 27.1 pg (ref 26.0–34.0)
MCHC: 31.9 g/dL (ref 30.0–36.0)
MCV: 85 fL (ref 80.0–100.0)
Platelets: 261 10*3/uL (ref 150–400)
RBC: 5.35 MIL/uL (ref 4.22–5.81)
RDW: 14.1 % (ref 11.5–15.5)
WBC: 9.6 10*3/uL (ref 4.0–10.5)
nRBC: 0 % (ref 0.0–0.2)

## 2019-07-10 LAB — CREATININE, SERUM
Creatinine, Ser: 0.58 mg/dL — ABNORMAL LOW (ref 0.61–1.24)
GFR calc Af Amer: >60 mL/min (ref >60)
GFR calc non Af Amer: >60 mL/min (ref >60)

## 2019-07-10 LAB — FIBRIN DERIVATIVES D-DIMER (ARMC ONLY): Fibrin derivatives D-dimer (ARMC): 819.89 ng/mL (FEU) — ABNORMAL HIGH (ref 0.00–499.00)

## 2019-07-10 LAB — ABO/RH: ABO/RH(D): O POS

## 2019-07-10 LAB — C-REACTIVE PROTEIN: CRP: 2.7 mg/dL — ABNORMAL HIGH (ref <1.0)

## 2019-07-10 LAB — FERRITIN: Ferritin: 444 ng/mL — ABNORMAL HIGH (ref 24–336)

## 2019-07-10 LAB — GLUCOSE, CAPILLARY: Glucose-Capillary: 150 mg/dL — ABNORMAL HIGH (ref 70–99)

## 2019-07-10 MED ORDER — SODIUM CHLORIDE 0.9 % IV SOLN: 100.0000 mg | Freq: Every day | INTRAVENOUS | Status: DC

## 2019-07-10 MED ORDER — ENOXAPARIN SODIUM 40 MG/0.4ML ~~LOC~~ SOLN: 40.0000 mg | Freq: Two times a day (BID) | SUBCUTANEOUS | Status: DC

## 2019-07-10 MED ORDER — ZINC SULFATE 220 (50 ZN) MG PO CAPS
220.0000 mg | ORAL_CAPSULE | Freq: Every day | ORAL | Status: DC
  Administered 2019-07-11 – 2019-07-12 (×2): 220 mg via ORAL
  Filled 2019-07-10 (×2): qty 1

## 2019-07-10 MED ORDER — GUAIFENESIN-DM 100-10 MG/5ML PO SYRP: 5.0000 mL | ORAL_SOLUTION | ORAL | Status: DC | PRN

## 2019-07-10 MED ORDER — SODIUM CHLORIDE 0.9 % IV SOLN: 200.0000 mg | Freq: Once | INTRAVENOUS | Status: DC

## 2019-07-10 MED ORDER — ACETAMINOPHEN 650 MG RE SUPP: 650.0000 mg | Freq: Four times a day (QID) | RECTAL | Status: DC | PRN

## 2019-07-10 MED ORDER — ONDANSETRON HCL 4 MG/2ML IJ SOLN: 4.0000 mg | Freq: Four times a day (QID) | INTRAMUSCULAR | Status: DC | PRN

## 2019-07-10 MED ORDER — DEXAMETHASONE SODIUM PHOSPHATE 10 MG/ML IJ SOLN
6.0000 mg | INTRAMUSCULAR | Status: DC
  Administered 2019-07-10 – 2019-07-11 (×2): 6 mg via INTRAVENOUS
  Filled 2019-07-10 (×2): qty 1

## 2019-07-10 MED ORDER — ACETAMINOPHEN 325 MG PO TABS: 650.0000 mg | ORAL_TABLET | Freq: Four times a day (QID) | ORAL | Status: DC | PRN

## 2019-07-10 MED ORDER — ENOXAPARIN SODIUM 40 MG/0.4ML ~~LOC~~ SOLN
40.0000 mg | SUBCUTANEOUS | Status: DC
  Administered 2019-07-10: 18:00:00 40 mg via SUBCUTANEOUS
  Filled 2019-07-10: qty 0.4

## 2019-07-10 MED ORDER — ENOXAPARIN SODIUM 60 MG/0.6ML ~~LOC~~ SOLN
55.0000 mg | SUBCUTANEOUS | Status: DC
  Administered 2019-07-11: 18:00:00 55 mg via SUBCUTANEOUS
  Filled 2019-07-10 (×3): qty 0.6

## 2019-07-10 MED ORDER — SODIUM CHLORIDE 0.9 % IV SOLN
100.0000 mg | Freq: Every day | INTRAVENOUS | Status: AC
  Administered 2019-07-11 – 2019-07-12 (×2): 100 mg via INTRAVENOUS
  Filled 2019-07-10 (×2): qty 20

## 2019-07-10 MED ORDER — FLUTICASONE PROPIONATE 50 MCG/ACT NA SUSP
2.0000 | Freq: Two times a day (BID) | NASAL | Status: DC
  Administered 2019-07-11 – 2019-07-12 (×3): 2 via NASAL
  Filled 2019-07-10: qty 16

## 2019-07-10 MED ORDER — HYDROCOD POLST-CPM POLST ER 10-8 MG/5ML PO SUER
5.0000 mL | Freq: Two times a day (BID) | ORAL | Status: DC
  Administered 2019-07-10 – 2019-07-12 (×4): 5 mL via ORAL
  Filled 2019-07-10 (×4): qty 5

## 2019-07-10 MED ORDER — ONDANSETRON HCL 4 MG PO TABS: 4.0000 mg | ORAL_TABLET | Freq: Four times a day (QID) | ORAL | Status: DC | PRN

## 2019-07-10 MED ORDER — VITAMIN C 500 MG PO TABS
500.0000 mg | ORAL_TABLET | Freq: Every day | ORAL | Status: DC
  Administered 2019-07-11 – 2019-07-12 (×2): 500 mg via ORAL
  Filled 2019-07-10 (×2): qty 1

## 2019-07-10 NOTE — Progress Notes (Signed)
Pt has gotten 3 doses of remdesivir at [facility]. We will continue it here to finish 5d.   Remdesivir 100mg  IV q24 x 2  Onnie Boer, PharmD, [place], AAHIVP, CPP Infectious Disease Pharmacist 07/10/2019 5:07 PM

## 2019-07-10 NOTE — Progress Notes (Signed)
Patient transferring to [facility] for further COVID treatment. Patient agreeable and able to update family as needed. [facility] here for transport. Telemetry removed and IVs saline locked. Patient does not need telemetry per MD. Belongings sent with patient. Report to [facility] on unit.

## 2019-07-10 NOTE — Progress Notes (Signed)
[facility] NAME: Jacob Gardner    MR#:  [id number]  DATE OF BIRTH:  [birth year]  SUBJECTIVE:  CHIEF COMPLAINT:   Chief Complaint  Patient presents with  . Shortness of Breath  Remains on 4 liters O2, SOB +, cough + REVIEW OF SYSTEMS:  Review of Systems  Constitutional: Negative for diaphoresis, fever, malaise/fatigue and weight loss.  HENT: Negative for ear discharge, ear pain, hearing loss, nosebleeds, sore throat and tinnitus.   Eyes: Negative for blurred vision and pain.  Respiratory: Positive for cough and shortness of breath. Negative for hemoptysis and wheezing.   Cardiovascular: Negative for chest pain, palpitations, orthopnea and leg swelling.  Gastrointestinal: Negative for abdominal pain, blood in stool, constipation, diarrhea, heartburn, nausea and vomiting.  Genitourinary: Negative for dysuria, frequency and urgency.  Musculoskeletal: Negative for back pain and myalgias.  Skin: Negative for itching and rash.  Neurological: Negative for dizziness, tingling, tremors, focal weakness, seizures, weakness and headaches.  Psychiatric/Behavioral: Negative for depression. The patient is not nervous/anxious.    DRUG ALLERGIES:  No Known Allergies VITALS:  Blood pressure 107/66, pulse (!) 55, temperature 97.8 F (36.6 C), temperature source Oral, resp. rate 19, height 5\' 5"  (1.651 m), weight 114.6 kg, SpO2 97 %. PHYSICAL EXAMINATION:  Physical Exam HENT:     Head: Normocephalic and atraumatic.  Eyes:     Conjunctiva/sclera: Conjunctivae normal.     Pupils: Pupils are equal, round, and reactive to light.  Neck:     Musculoskeletal: Normal range of motion and neck supple.     Thyroid: No thyromegaly.     Trachea: No tracheal deviation.  Cardiovascular:     Rate and Rhythm: Normal rate and regular rhythm.     Heart sounds: Normal heart sounds.  Pulmonary:     Effort: Pulmonary effort is normal. No respiratory distress.     Breath sounds:  Normal breath sounds. No wheezing.  Chest:     Chest wall: No tenderness.  Abdominal:     General: Bowel sounds are normal. There is no distension.     Palpations: Abdomen is soft.     Tenderness: There is no abdominal tenderness.  Musculoskeletal: Normal range of motion.  Skin:    General: Skin is warm and dry.     Findings: No rash.  Neurological:     Mental Status: He is alert and oriented to person, place, and time.     Cranial Nerves: No cranial nerve deficit.    LABORATORY PANEL:  Male CBC Recent Labs  Lab 07/10/19 0602  WBC 9.8  HGB 13.4  HCT 40.4  PLT 230   ------------------------------------------------------------------------------------------------------------------ Chemistries  Recent Labs  Lab 07/08/19 1146  07/10/19 0602  NA 137   < > 139  K 3.4*   < > 3.9  CL 105   < > 107  CO2 23   < > 24  GLUCOSE 136*   < > 162*  BUN 11   < > 11  CREATININE 0.77   < > 0.56*  CALCIUM 8.2*   < > 8.2*  MG 2.1  --   --   AST 52*   < > 33  ALT 52*   < > 53*  ALKPHOS 48   < > 42  BILITOT 0.7   < > 0.6   < > = values in this interval not displayed.   RADIOLOGY:  No results found. ASSESSMENT AND PLAN:   Acute respiratory failure with  hypoxia due to acute respiratory disease due to COVID-19 virus: -Remdesivir day 3/5 -Solumedrol day 3/10 -Daily vitamin C, zinc.  -prn albuterol inhaler -PRN Mucinex for cough -neg Blood culture -Trending down D-dimer, LFT, CRP, LDH, ferritin, fibinogen,  - maintain an awake prone position for 16+ hours a day, if possible, with a minimum of 2-3 hours at a time -Will attempt to maintain euvolemia to a net negative fluid status -Patient is on wait list at [facility], may get transferred there if bed available today  Hypokalemia: repleted and resolved    All the records are reviewed and case discussed with Care Management/Social Worker. Management plans discussed with the patient, nursing and they are in agreement.  CODE  STATUS: Full Code  TOTAL TIME TAKING CARE OF THIS PATIENT: 35 minutes.   More than 50% of the time was spent in counseling/coordination of care: Gwendolyn Lima M.D on 07/10/2019 at 1:50 PM  Between 7am to 6pm - Pager - [phone]  After 6pm go to [email or web address] - password [email or web address]  [facility]   CC: Primary care physician; Patient, No Pcp Per  Note: This dictation was prepared with Dragon dictation along with smaller phrase technology. Any transcriptional errors that result from this process are unintentional.

## 2019-07-10 NOTE — Progress Notes (Signed)
Ok to increase lovenox to 0.5mg /kg/day due to BMI 42 per Dr. Cathlean Sauer.  Onnie Boer, PharmD, BCIDP, AAHIVP, CPP Infectious Disease Pharmacist 07/10/2019 5:49 PM

## 2019-07-10 NOTE — Plan of Care (Signed)
  Problem: Education: Goal: Knowledge of risk factors and measures for prevention of condition will improve Outcome: Progressing   Problem: Coping: Goal: Psychosocial and spiritual needs will be supported Outcome: Progressing   Problem: Respiratory: Goal: Will maintain a patent airway Outcome: Progressing Goal: Complications related to the disease process, condition or treatment will be avoided or minimized Outcome: Progressing   

## 2019-07-10 NOTE — Progress Notes (Signed)
Anticoagulation monitoring(Lovenox):  43yo  F ordered Lovenox 40 mg Q24h  Filed Weights   07/08/19 1127 07/08/19 1700  Weight: 220 lb (99.8 kg) 252 lb 11.2 oz (114.6 kg)   BMI 42   Lab Results  Component Value Date   CREATININE 0.56 (L) 07/10/2019   CREATININE 0.53 (L) 07/09/2019   CREATININE 0.77 07/08/2019   Estimated Creatinine Clearance: 140.7 mL/min (A) (by C-G formula based on SCr of 0.56 mg/dL (L)). Hemoglobin & Hematocrit     Component Value Date/Time   HGB 13.4 07/10/2019 0602   HCT 40.4 07/10/2019 0602     Per Protocol for Patient with estCrcl >30 ml/min and BMI >40, will transition to Lovenox 40 mg Q12h.      Chinita Greenland PharmD Clinical Pharmacist 07/10/2019

## 2019-07-10 NOTE — H&P (Signed)
History and Physical    [name] VQM:[id number] DOB: [birth year] DOA: 07/10/2019  PCP: Patient, No Pcp Per   Patient coming from: [facility]   Chief Complaint:  Dyspnea  HPI: Jacob Gardner is a 43 y.o. male with no significant past medical history who presented with dyspnea.  He was admitted to [facility] on December 6 with a diagnosis of SARS COVID-19 viral pneumonia.  Patient tested positive for SARS COVID-19 December 3, are consistent with central pleuritic chest pain, fevers, chills, nausea, vomiting, and poor appetite.  He developed progressive dyspnea that prompted him to go to the hospital.  On his initial physical examination his oximetry was 88% on room air, his blood pressure was 104/66, heart rate 80, respiratory 24, temperature 98.7.  His lungs had no wheezing or rales, heart S1-S2 present rhythm, soft abdomen and no lower extremity edema. Sodium 137, potassium 3.4, chloride 105, bicarb 23, glucose 136, BUN 11, creatinine 0.77, his white count was 6.1, hemoglobin 14.2, hematocrit 43.1, platelets 184.  His chest x-ray had interstitial infiltrates, right lower lobe, left lower lobe, left upper lobe.  EKG had 86 bpm, normal axis, normal intervals, sinus rhythm, no ST segment or T wave changes.  Patient was admitted to the hospital with working diagnosis of acute hypoxic respiratory failure due to SARS COVID-19 viral pneumonia.  Patient was admitted to the medical ward, he has been treated with remdesivir, systemic steroids, bronchodilators and antitussive agents.  Patient has been responding well to medical therapy.  He has been transferred to the [facility] December 8, to continue management of his hypoxic respiratory failure.  Review of Systems:  1. General: No fevers, no chills, no weight gain or weight loss 2. ENT: No runny nose or sore throat, no hearing disturbances/positive sinus congestion 3. Pulmonary: positive dyspnea and cough, but no wheezing, or  hemoptysis 4. Cardiovascular: No angina, claudication, lower extremity edema, pnd or orthopnea 5. Gastrointestinal: No nausea or vomiting, no diarrhea or constipation 6. Hematology: No easy bruisability or frequent infections 7. Urology: No dysuria, hematuria or increased urinary frequency 8. Dermatology: No rashes. 9. Neurology: No seizures or paresthesias 10. Musculoskeletal: No joint pain or deformities  Past Medical History:  Diagnosis Date  . Varicose vein of leg     No past surgical history on file.   reports that he has been smoking. He uses smokeless tobacco. He reports that he does not drink alcohol or use drugs.  No Known Allergies  Family History  Problem Relation Age of Onset  . Diabetes Mellitus II Mother      Prior to Admission medications   Not on File    Physical Exam: Vitals:   07/10/19 1622  BP: 101/63  Pulse: 70  Resp: 20  Temp: 98.2 F (36.8 C)  TempSrc: Oral  SpO2: 94%    Vitals:   07/10/19 1622  BP: 101/63  Pulse: 70  Resp: 20  Temp: 98.2 F (36.8 C)  TempSrc: Oral  SpO2: 94%   General: deconditioned  Neurology: Awake and alert, non focal Head and Neck. Head normocephalic. Neck supple with no adenopathy or thyromegaly.   E ENT: mild pallor, no icterus, oral mucosa moist Cardiovascular: No JVD. S1-S2 present, . No lower extremity edema. Pulmonary: positive breath sounds bilaterally. Gastrointestinal. Abdomen with, no organomegaly, non tender, no rebound or guarding Skin. No rashes Musculoskeletal: no joint deformities    Labs on Admission: I have personally reviewed following labs and imaging studies  CBC: Recent  Labs  Lab 07/08/19 1146 07/09/19 0641 07/10/19 0602  WBC 6.1 3.9* 9.8  NEUTROABS 4.4 3.0 8.1*  HGB 14.2 13.3 13.4  HCT 43.1 39.4 40.4  MCV 83.9 81.1 81.6  PLT 184 184 213   Basic Metabolic Panel: Recent Labs  Lab 07/08/19 1146 07/09/19 0641 07/10/19 0602  NA 137 138 139  K 3.4* 3.9 3.9  CL 105 108 107   CO2 23 22 24   GLUCOSE 136* 184* 162*  BUN 11 10 11   CREATININE 0.77 0.53* 0.56*  CALCIUM 8.2* 8.1* 8.2*  MG 2.1  --   --    GFR: Estimated Creatinine Clearance: 140.7 mL/min (A) (by C-G formula based on SCr of 0.56 mg/dL (L)). Liver Function Tests: Recent Labs  Lab 07/08/19 1146 07/09/19 0641 07/10/19 0602  AST 52* 45* 33  ALT 52* 55* 53*  ALKPHOS 48 44 42  BILITOT 0.7 0.6 0.6  PROT 7.7 6.8 6.4*  ALBUMIN 3.3* 2.9* 2.6*   Recent Labs  Lab 07/08/19 1146  LIPASE 38   No results for input(s): AMMONIA in the last 168 hours. Coagulation Profile: No results for input(s): INR, PROTIME in the last 168 hours. Cardiac Enzymes: No results for input(s): CKTOTAL, CKMB, CKMBINDEX, TROPONINI in the last 168 hours. BNP (last 3 results) No results for input(s): PROBNP in the last 8760 hours. HbA1C: No results for input(s): HGBA1C in the last 72 hours. CBG: Recent Labs  Lab 07/10/19 0819  GLUCAP 150*   Lipid Profile: No results for input(s): CHOL, HDL, LDLCALC, TRIG, CHOLHDL, LDLDIRECT in the last 72 hours. Thyroid Function Tests: No results for input(s): TSH, T4TOTAL, FREET4, T3FREE, THYROIDAB in the last 72 hours. Anemia Panel: Recent Labs    07/09/19 0641 07/10/19 0602  FERRITIN 455* 444*   Urine analysis: No results found for: COLORURINE, APPEARANCEUR, LABSPEC, PHURINE, GLUCOSEU, HGBUR, BILIRUBINUR, KETONESUR, PROTEINUR, UROBILINOGEN, NITRITE, LEUKOCYTESUR  Radiological Exams on Admission: No results found.    Assessment/Plan Principal Problem:   Acute respiratory failure with hypoxia (HCC) Active Problems:   Hypokalemia   COVID-19 virus infection   Pneumonia due to COVID-19 virus   1.  Acute hypoxic respiratory failure due to SARS COVID-19 viral pneumonia.   RR: 20  Pulse oxymetry: 94%  Fi02: 3 to 4 LPM per [place]  COVID-19 Labs  Recent Labs    07/08/19 1146 07/08/19 1553 07/09/19 0641 07/10/19 0602  FERRITIN 558*  --  455* 444*  LDH  --  285*  --    --   CRP 7.7*  --  7.7* 2.7*    No results found for: SARSCOV2NAA  Inflammatory markers are improving..  Will continue medical therapy with remdesivir #3/5 and systemic corticosteroids with dexamethasone.  Continue antitussive agents, bronchodilators and airway clearance techniques with incentive spirometer and flutter valve.  Out of bed to chair 3 times daily with meals, physical therapy evaluation.  2.  Hypokalemia.  Potassium today 3.9, kidney function preserved with serum creatinine 0.53, bicarb 22.  Continue to encourage p.o. intake.   DVT prophylaxis: Enoxaparin Code Status: Full code Family Communication: No family at bedside Disposition Plan: Medical ward Consults called: None Admission status: Inpatient   Mauricio Gerome Apley MD [facility]   07/10/2019, 5:08 PM

## 2019-07-10 NOTE — Progress Notes (Signed)
Pt wife and son were updated on pt current status and all questions were answered.

## 2019-07-11 ENCOUNTER — Encounter (HOSPITAL_COMMUNITY): Payer: Self-pay

## 2019-07-11 ENCOUNTER — Other Ambulatory Visit: Payer: Self-pay

## 2019-07-11 LAB — COMPREHENSIVE METABOLIC PANEL
ALT: 107 U/L — ABNORMAL HIGH (ref 0–44)
AST: 63 U/L — ABNORMAL HIGH (ref 15–41)
Albumin: 3.1 g/dL — ABNORMAL LOW (ref 3.5–5.0)
Alkaline Phosphatase: 48 U/L (ref 38–126)
Anion gap: 12 (ref 5–15)
BUN: 14 mg/dL (ref 6–20)
CO2: 26 mmol/L (ref 22–32)
Calcium: 8.8 mg/dL — ABNORMAL LOW (ref 8.9–10.3)
Chloride: 102 mmol/L (ref 98–111)
Creatinine, Ser: 0.61 mg/dL (ref 0.61–1.24)
GFR calc Af Amer: >60 mL/min (ref >60)
GFR calc non Af Amer: >60 mL/min (ref >60)
Glucose, Bld: 136 mg/dL — ABNORMAL HIGH (ref 70–99)
Potassium: 4.3 mmol/L (ref 3.5–5.1)
Sodium: 140 mmol/L (ref 135–145)
Total Bilirubin: 0.8 mg/dL (ref 0.3–1.2)
Total Protein: 6.9 g/dL (ref 6.5–8.1)

## 2019-07-11 LAB — C-REACTIVE PROTEIN: CRP: 1.2 mg/dL — ABNORMAL HIGH (ref <1.0)

## 2019-07-11 LAB — D-DIMER, QUANTITATIVE: D-Dimer, Quant: 0.65 ug/mL-FEU — ABNORMAL HIGH (ref 0.00–0.50)

## 2019-07-11 LAB — TYPE AND SCREEN
ABO/RH(D): O POS
Antibody Screen: NEGATIVE

## 2019-07-11 LAB — FERRITIN: Ferritin: 416 ng/mL — ABNORMAL HIGH (ref 24–336)

## 2019-07-11 MED ORDER — ENSURE ENLIVE PO LIQD
237.0000 mL | Freq: Three times a day (TID) | ORAL | Status: DC
  Administered 2019-07-11 – 2019-07-12 (×4): 237 mL via ORAL

## 2019-07-11 MED ORDER — FAMOTIDINE 20 MG PO TABS
20.0000 mg | ORAL_TABLET | Freq: Every day | ORAL | Status: DC
  Administered 2019-07-11 – 2019-07-12 (×2): 20 mg via ORAL
  Filled 2019-07-11 (×2): qty 1

## 2019-07-11 MED ORDER — ADULT MULTIVITAMIN W/MINERALS CH
1.0000 | ORAL_TABLET | Freq: Every day | ORAL | Status: DC
  Administered 2019-07-11 – 2019-07-12 (×2): 1 via ORAL
  Filled 2019-07-11 (×2): qty 1

## 2019-07-11 NOTE — Progress Notes (Signed)
Pt resting in bed with no complaints. Lung sounds diminished and no coughing observed during assessment. No complaints of pain at this time. Pt on RA and sats 95%. Will assess later to determine if pt need supp O2 with activity/ambulation.

## 2019-07-11 NOTE — Progress Notes (Signed)
PROGRESS NOTE  Jacob Gardner UJW:[id number] DOB: 03-20-1976 DOA: 07/10/2019  PCP: Patient, No Pcp Per  Brief History/Interval Summary: 43 y.o. male with no significant past medical history who presented with dyspnea.  He was admitted to [facility] on December 6 with a diagnosis of SARS COVID-19 viral pneumonia.  Patient was noted to be hypoxic.  He was started on steroids and remdesivir.  Transferred to this facility for further management.  Reason for Visit: Pneumonia due to COVID-19.  Acute respiratory failure with hypoxia  Consultants: None  Procedures: None  Antibiotics: Anti-infectives (From admission, onward)   Start     Dose/Rate Route Frequency Ordered Stop   07/11/19 1000  remdesivir 100 mg in sodium chloride 0.9 % 100 mL IVPB  Status:  Discontinued     100 mg 200 mL/hr over 30 Minutes Intravenous Daily 07/10/19 1636 07/10/19 1641   07/11/19 1000  remdesivir 100 mg in sodium chloride 0.9 % 100 mL IVPB     100 mg 200 mL/hr over 30 Minutes Intravenous Daily 07/10/19 1641 07/13/19 0959   07/10/19 1645  remdesivir 200 mg in sodium chloride 0.9% 250 mL IVPB  Status:  Discontinued     200 mg 580 mL/hr over 30 Minutes Intravenous Once 07/10/19 1636 07/10/19 1641      Subjective/Interval History: Patient states that he is feeling better.  Occasional shortness of breath especially with exertion.  Occasional dry cough.  No chest pain. No Nausea or vomiting.    Assessment/Plan:  Acute Hypoxic Resp. Failure/Pneumonia due to COVID-19  COVID-19 Labs  Recent Labs    07/08/19 1553 07/09/19 0641 07/10/19 0602 07/11/19 0405  DDIMER  --   --   --  0.65*  FERRITIN  --  455* 444* 416*  LDH 285*  --   --   --   CRP  --  7.7* 2.7* 1.2*   COVID-19 positive test result available from 07/05/2019 under care everywhere   Objective findings: Fever: Afebrile Oxygen requirements: Saturating in the 90s on 2 L by nasal cannula.  COVID 19 Therapeutics:  Antibacterials: None Remdesivir: Day 4 today Steroids: Dexamethasone 6 mg daily Diuretics: No diuretics on a scheduled basis Actemra: Not given Convalescent Plasma: Not given Vitamin C and Zinc: Continue PUD Prophylaxis: Initiate Pepcid DVT Prophylaxis:  Lovenox 55 mg once a day  Patient seems to be improving from a respiratory standpoint.  Hopefully will be able to wean him down to room air by the end of the day today.  Inflammatory markers have improved with CRP down to 1.2.  D-dimer 0.65.  Ferritin 416.  He will complete course of remdesivir tomorrow.  Continue with steroids.  Continue to mobilize.  Out of bed to chair.  Incentive spirometry.  Hypokalemia Resolved.  Obesity Estimated body mass index is 42.05 kg/m as calculated from the following:   Height as of this encounter: 5\' 5"  (1.651 m).   Weight as of this encounter: 114.6 kg.   DVT Prophylaxis: Lovenox Code Status: Full code Family Communication: Discussed with the patient Disposition Plan: Hopefully return home when improved.  Continue to mobilize.   Medications:  Scheduled: . chlorpheniramine-HYDROcodone  5 mL Oral Q12H  . dexamethasone (DECADRON) injection  6 mg Intravenous Q24H  . enoxaparin (LOVENOX) injection  55 mg Subcutaneous Q24H  . feeding supplement (ENSURE ENLIVE)  237 mL Oral TID BM  . fluticasone  2 spray Each Nare BID  . multivitamin with minerals  1 tablet Oral Daily  .  vitamin C  500 mg Oral Daily  . zinc sulfate  220 mg Oral Daily   Continuous: . remdesivir 100 mg in NS 100 mL 100 mg (07/11/19 0932)   OXB:[id number] **OR** acetaminophen, guaiFENesin-dextromethorphan, ondansetron **OR** ondansetron (ZOFRAN) IV   Objective:  Vital Signs  Vitals:   07/11/19 0122 07/11/19 0123 07/11/19 0804 07/11/19 0951  BP:  122/72 107/74   Pulse:  70    Resp:  20    Temp:  98.7 F (37.1 C) 97.7 F (36.5 C)   TempSrc:  Oral Oral   SpO2:   93% 93%  Weight: 114.6 kg 114.6 kg    Height:  5\' 5"   (1.651 m)      Intake/Output Summary (Last 24 hours) at 07/11/2019 1506 Last data filed at 07/11/2019 1414 Gross per 24 hour  Intake 420 ml  Output 550 ml  Net -130 ml   Filed Weights   07/11/19 0122 07/11/19 0123  Weight: 114.6 kg 114.6 kg    General appearance: Awake alert.  In no distress Resp: Normal effort at rest.  Coarse breath sound bilaterally with a few crackles at the bases.  No wheezing or rhonchi.   Cardio: S1-S2 is normal regular.  No S3-S4.  No rubs murmurs or bruit GI: Abdomen is soft.  Nontender nondistended.  Bowel sounds are present normal.  No masses organomegaly Extremities: No edema.  Full range of motion of lower extremities. Neurologic: Alert and oriented x3.  No focal neurological deficits.    Lab Results:  Data Reviewed: I have personally reviewed following labs and imaging studies  CBC: Recent Labs  Lab 07/08/19 1146 07/09/19 0641 07/10/19 0602 07/10/19 1710  WBC 6.1 3.9* 9.8 9.6  NEUTROABS 4.4 3.0 8.1*  --   HGB 14.2 13.3 13.4 14.5  HCT 43.1 39.4 40.4 45.5  MCV 83.9 81.1 81.6 85.0  PLT 184 184 230 261    Basic Metabolic Panel: Recent Labs  Lab 07/08/19 1146 07/09/19 0641 07/10/19 0602 07/10/19 1710 07/11/19 0405  NA 137 138 139  --  140  K 3.4* 3.9 3.9  --  4.3  CL 105 108 107  --  102  CO2 23 22 24   --  26  GLUCOSE 136* 184* 162*  --  136*  BUN 11 10 11   --  14  CREATININE 0.77 0.53* 0.56* 0.58* 0.61  CALCIUM 8.2* 8.1* 8.2*  --  8.8*  MG 2.1  --   --   --   --     GFR: Estimated Creatinine Clearance: 140.7 mL/min (by C-G formula based on SCr of 0.61 mg/dL).  Liver Function Tests: Recent Labs  Lab 07/08/19 1146 07/09/19 0641 07/10/19 0602 07/11/19 0405  AST 52* 45* 33 63*  ALT 52* 55* 53* 107*  ALKPHOS 48 44 42 48  BILITOT 0.7 0.6 0.6 0.8  PROT 7.7 6.8 6.4* 6.9  ALBUMIN 3.3* 2.9* 2.6* 3.1*    Recent Labs  Lab 07/08/19 1146  LIPASE 38    CBG: Recent Labs  Lab 07/10/19 0819  GLUCAP 150*    Anemia  Panel: Recent Labs    07/10/19 0602 07/11/19 0405  FERRITIN 444* 416*    Recent Results (from the past 240 hour(s))  Culture, blood (x 2)     Status: None (Preliminary result)   Collection Time: 07/08/19  3:53 PM   Specimen: BLOOD  Result Value Ref Range Status   Specimen Description BLOOD BLOOD RIGHT HAND  Final   Special Requests  Final    BOTTLES DRAWN AEROBIC AND ANAEROBIC Blood Culture adequate volume   Culture   Final    NO GROWTH 3 DAYS Performed at [facility], [address] [place], [place] [address]    Report Status PENDING  Incomplete  Culture, blood (x 2)     Status: None (Preliminary result)   Collection Time: 07/08/19  3:53 PM   Specimen: BLOOD  Result Value Ref Range Status   Specimen Description BLOOD LEFT ANTECUBITAL  Final   Special Requests   Final    BOTTLES DRAWN AEROBIC AND ANAEROBIC Blood Culture results may not be optimal due to an excessive volume of blood received in culture bottles   Culture   Final    NO GROWTH 3 DAYS Performed at [facility], [address] [place], [place] [address]    Report Status PENDING  Incomplete      Radiology Studies: No results found.     LOS: 1 day   Raeshawn Tafolla [employer] on [email or web address]  07/11/2019, 3:06 PM

## 2019-07-11 NOTE — Progress Notes (Signed)
Pt resting in bed. Pt is concerned about being d/c too soon because he has 57 month old baby at home. Pt is not in any distress at this time.

## 2019-07-11 NOTE — Evaluation (Signed)
Physical Therapy Evaluation Patient Details Name: Jacob Gardner MRN: [id number] DOB: 08-Feb-1976 Today's Date: 07/11/2019   History of Present Illness  43 y.o. male with no significant PMH who presented with dyspnea.  He was admitted to [facility] on December 6 with a diagnosis of SARS COVID-19 viral pneumonia, transferred to the [facility] December 8, to continue management of his hypoxic respiratory failure.  Clinical Impression   Pt admitted with above diagnosis. PTA was living home with spouse and was very independent with ADls and IADls. Pt currently with functional limitations due to the deficits listed below (see PT Problem List). Currently presents with decreased activity tolerance, independence also balance and coordination. Pt will benefit from skilled PT to increase their independence and safety with mobility to allow discharge to the venue listed below.  Pt was on room air throughout assessment and able to maintain 02 sats >87%     Follow Up Recommendations No PT follow up    Equipment Recommendations  None recommended by PT    Recommendations for Other Services Rehab consult     Precautions / Restrictions Precautions Precautions: None Restrictions Weight Bearing Restrictions: No      Mobility  Bed Mobility Overal bed mobility: Needs Assistance Bed Mobility: Supine to Sit;Sit to Supine     Supine to sit: Supervision Sit to supine: Supervision      Transfers Overall transfer level: Needs assistance Equipment used: None Transfers: Sit to/from [employer] Transfers Sit to Stand: Supervision;Min guard Stand pivot transfers: Supervision;Min guard          Ambulation/Gait Ambulation/Gait assistance: Supervision Gait Distance (Feet): 200 Feet Assistive device: None Gait Pattern/deviations: Staggering left;Staggering right     General Gait Details: noted 1 LOB with head turn to R, pt himself mentioned this also but did not seeany hx of nor any  other possible vestribular issues of note  Stairs            Wheelchair Mobility    Modified Rankin (Stroke Patients Only)       Balance Overall balance assessment: Needs assistance Sitting-balance support: Feet supported Sitting balance-Leahy Scale: Good     Standing balance support: During functional activity Standing balance-Leahy Scale: Fair Standing balance comment: 1 lob with ambulation and head turn to R                             Pertinent Vitals/Pain Pain Assessment: No/denies pain    Home Living Family/patient expects to be discharged to:: Private residence Living Arrangements: Spouse/significant other;Children   Type of Home: Apartment                Prior Function Level of Independence: Independent               Hand Dominance        Extremity/Trunk Assessment   Upper Extremity Assessment Upper Extremity Assessment: Defer to OT evaluation    Lower Extremity Assessment Lower Extremity Assessment: Generalized weakness       Communication   Communication: No difficulties;Prefers language other than English  Cognition Arousal/Alertness: Awake/alert Behavior During Therapy: WFL for tasks assessed/performed Overall Cognitive Status: Within Functional Limits for tasks assessed                                        General Comments      Exercises  Assessment/Plan    PT Assessment Patient needs continued PT services  PT Problem List Decreased strength;Decreased activity tolerance;Decreased balance;Decreased mobility;Decreased coordination;Decreased safety awareness       PT Treatment Interventions Gait training;Stair training;Functional mobility training;Therapeutic activities;Therapeutic exercise;Balance training;Neuromuscular re-education;Patient/family education    PT Goals (Current goals can be found in the Care Plan section)  Acute Rehab PT Goals Patient Stated Goal: did not states  specific goals Time For Goal Achievement: 07/25/19 Potential to Achieve Goals: Good    Frequency Min 3X/week   Barriers to discharge        Co-evaluation               AM-PAC PT "6 Clicks" Mobility  Outcome Measure Help needed turning from your back to your side while in a flat bed without using bedrails?: None Help needed moving from lying on your back to sitting on the side of a flat bed without using bedrails?: None Help needed moving to and from a bed to a chair (including a wheelchair)?: A Little Help needed standing up from a chair using your arms (e.g., wheelchair or bedside chair)?: A Little Help needed to walk in hospital room?: A Little Help needed climbing 3-5 steps with a railing? : A Lot 6 Click Score: 19    End of Session   Activity Tolerance: Patient limited by fatigue;Patient limited by lethargy Patient left: in bed;with call bell/phone within reach Nurse Communication: Mobility status PT Visit Diagnosis: Unsteadiness on feet (R26.81);Other abnormalities of gait and mobility (R26.89)    Time: 6546-5035 PT Time Calculation (min) (ACUTE ONLY): 28 min   Charges:   PT Evaluation $PT Eval Moderate Complexity: 1 Mod PT Treatments $Gait Training: 8-22 mins        Horald Chestnut, PT   Delford Field 07/11/2019, 1:31 PM

## 2019-07-11 NOTE — Progress Notes (Signed)
Initial Nutrition Assessment  RD working remotely.  DOCUMENTATION CODES:   Morbid obesity  INTERVENTION:   -Continue 500 mg vitamin C daily -220 mg zinc sulfate daily -Ensure Enlive po TID, each supplement provides 350 kcal and 20 grams of protein -Magic cup TID with meals, each supplement provides 290 kcal and 9 grams of protein -MVI with minerals daily  NUTRITION DIAGNOSIS:   Increased nutrient needs related to acute illness(COVID-19) as evidenced by estimated needs.  GOAL:   Patient will meet greater than or equal to 90% of their needs  MONITOR:   PO intake, Supplement acceptance, Labs, Weight trends, Skin, I & O's  REASON FOR ASSESSMENT:   Consult Assessment of nutrition requirement/status  ASSESSMENT:   Jacob Gardner is a 43 y.o. male with no significant past medical history who presented with dyspnea.  He was admitted to [facility] on December 6 with a diagnosis of SARS COVID-19 viral pneumonia.  Patient tested positive for SARS COVID-19 December 3, are consistent with central pleuritic chest pain, fevers, chills, nausea, vomiting, and poor appetite.  He developed progressive dyspnea that prompted him to go to the hospital.  On his initial physical examination his oximetry was 88% on room air, his blood pressure was 104/66, heart rate 80, respiratory 24, temperature 98.7.  His lungs had no wheezing or rales, heart S1-S2 present rhythm, soft abdomen and no lower extremity edema.Sodium 137, potassium 3.4, chloride 105, bicarb 23, glucose 136, BUN 11, creatinine 0.77, his white count was 6.1, hemoglobin 14.2, hematocrit 43.1, platelets 184.  His chest x-ray had interstitial infiltrates, right lower lobe, left lower lobe, left upper lobe.  EKG had 86 bpm, normal axis, normal intervals, sinus rhythm, no ST segment or T wave changes.  Pt admitted with acute hypoxic respiratory failure due to COVID-19 pneumonia.   12/8- transferred from [facility] to [facility]  Per MD  notes, pt with poor appetite over the past week secondary to COVID-19 diagnosis. No meal completion data available to assess at this time.   Medications reviewed and include IV decadron and IV remdesivir.   Pt with increased nutrient needs due to acute illness and would benefit from nutrient dense supplement. One Ensure Enlive supplement provides 350 kcals, 20 grams protein, and 44-45 grams of carbohydrate vs one Glucerna shake supplement, which provides 220 kcals, 10 grams of protein, and 26 grams of carbohydrate. Given pt's IV steroid use (IV decadron) and stress response due to acute illness, RD will continue to monitor PO intake, CBGS, and adjust supplement regimen as appropriate.   Labs reviewed: CBGS: 150.   Diet Order:   Diet Order            Diet regular Room service appropriate? Yes; Fluid consistency: Thin  Diet effective now              EDUCATION NEEDS:   No education needs have been identified at this time  Skin:  Skin Assessment: Reviewed RN Assessment  Last BM:  07/10/19  Height:   Ht Readings from Last 1 Encounters:  07/11/19 5\' 5"  (1.651 m)    Weight:   Wt Readings from Last 1 Encounters:  07/11/19 114.6 kg    Ideal Body Weight:  61.8 kg  BMI:  Body mass index is 42.05 kg/m.  Estimated Nutritional Needs:   Kcal:  2150-2350  Protein:  125-155 grams  Fluid:  > 2.2 L    Mishal Probert A. Jimmye Norman, RD, LDN, [place] Registered Dietitian II Certified Diabetes Care and  Education Specialist Pager: [phone] After hours Pager: [phone]

## 2019-07-11 NOTE — Progress Notes (Signed)
Pt arrived to unit. Identified appropriately and oriented to room and unit. Pt stable.

## 2019-07-11 NOTE — Progress Notes (Signed)
Pt is being transferred to 318. Gave report to Cara,RN.

## 2019-07-11 NOTE — Plan of Care (Signed)
  Problem: Education: Goal: Knowledge of risk factors and measures for prevention of condition will improve Outcome: Progressing   Problem: Coping: Goal: Psychosocial and spiritual needs will be supported Outcome: Progressing   Problem: Respiratory: Goal: Will maintain a patent airway Outcome: Progressing Goal: Complications related to the disease process, condition or treatment will be avoided or minimized Outcome: Progressing   

## 2019-07-11 NOTE — Discharge Summary (Signed)
Patient was admitted overnight for Covid pneumonia.  Please see my last progress note and H & P by Dr Blaine Hamper for further details. Patient had bed available at [facility] & was transferred.

## 2019-07-11 NOTE — Evaluation (Signed)
Occupational Therapy Evaluation Patient Details Name: Jacob Gardner MRN: [id number] DOB: Dec 27, 1975 Today's Date: 07/11/2019    History of Present Illness 43 y.o. male with no significant PMH who presented with dyspnea.  He was admitted to [facility] on December 6 with a diagnosis of SARS COVID-19 viral pneumonia, transferred to the [facility] December 8, to continue management of his hypoxic respiratory failure.   Clinical Impression   PTA Pt indpendent in ADL and mobility. Pt largely min guard/supervision for transfers, UB and LB ADL throughout session (min guard due to slight LOB with PT earlier...none experienced during session) Pt able to complete sink level grooming. Reviewed energy conservation strategies verbally with patient, emphasis on shower safety. OT will follow acutely to maximize safety and independence in ADL and transfers.     Follow Up Recommendations  No OT follow up    Equipment Recommendations  Tub/shower seat    Recommendations for Other Services       Precautions / Restrictions Precautions Precautions: None Restrictions Weight Bearing Restrictions: No      Mobility Bed Mobility Overal bed mobility: Needs Assistance Bed Mobility: Supine to Sit;Sit to Supine     Supine to sit: Supervision Sit to supine: Supervision   General bed mobility comments: use of bed rail, no physical assist needed  Transfers Overall transfer level: Needs assistance Equipment used: None Transfers: Sit to/from Stand Sit to Stand: Supervision;Min guard         General transfer comment: supervision for safety    Balance Overall balance assessment: Needs assistance Sitting-balance support: Feet supported Sitting balance-Leahy Scale: Good     Standing balance support: During functional activity Standing balance-Leahy Scale: Fair                             ADL either performed or assessed with clinical judgement   ADL Overall ADL's : Needs  assistance/impaired Eating/Feeding: Independent   Grooming: Wash/dry hands;Wash/dry face;Oral care;Standing;Supervision/safety Grooming Details (indicate cue type and reason): sink level Upper Body Bathing: Supervision/ safety;Standing   Lower Body Bathing: Supervison/ safety;Sitting/lateral leans   Upper Body Dressing : Supervision/safety;Sitting   Lower Body Dressing: Supervision/safety;Sitting/lateral leans Lower Body Dressing Details (indicate cue type and reason): able to perfo4rm figure 4 to don socks Toilet Transfer: Min guard;Ambulation;Regular Toilet;Grab bars   Toileting- Clothing Manipulation and Hygiene: Min guard       Functional mobility during ADLs: Min guard General ADL Comments: fatigues quickly, verbally initiated energy conservation education     Vision Patient Visual Report: No change from baseline       Perception     Praxis      Pertinent Vitals/Pain Pain Assessment: No/denies pain     Hand Dominance     Extremity/Trunk Assessment Upper Extremity Assessment Upper Extremity Assessment: Overall WFL for tasks assessed   Lower Extremity Assessment Lower Extremity Assessment: Defer to PT evaluation       Communication Communication Communication: No difficulties;Prefers language other than English   Cognition Arousal/Alertness: Awake/alert Behavior During Therapy: WFL for tasks assessed/performed;Anxious Overall Cognitive Status: Within Functional Limits for tasks assessed                                 General Comments: anxious about going home too soon   General Comments  on RA throughout session, and saturations remained >89%    Exercises     Shoulder  Instructions      Home Living Family/patient expects to be discharged to:: Private residence Living Arrangements: Spouse/significant other;Children Available Help at Discharge: Family(new 51 month old baby at home) Type of Home: Apartment       Home Layout: One  level     Bathroom Shower/Tub: Teacher, early years/pre: Standard     Home Equipment: None          Prior Functioning/Environment Level of Independence: Independent                 OT Problem List: Decreased activity tolerance;Impaired balance (sitting and/or standing)      OT Treatment/Interventions: Self-care/ADL training;Therapeutic exercise;Energy conservation;Therapeutic activities;Patient/family education;Balance training    OT Goals(Current goals can be found in the care plan section) Acute Rehab OT Goals Patient Stated Goal: get better for his family OT Goal Formulation: With patient Time For Goal Achievement: 07/25/19 Potential to Achieve Goals: Good ADL Goals Pt Will Perform Grooming: with modified independence;standing Pt Will Perform Upper Body Dressing: with modified independence;sitting Pt Will Perform Lower Body Dressing: with modified independence;sit to/from stand Pt Will Transfer to Toilet: with modified independence;ambulating Pt Will Perform Toileting - Clothing Manipulation and hygiene: with modified independence;sit to/from stand Pt/caregiver will Perform Home Exercise Program: Both right and left upper extremity;With theraband;Independently Additional ADL Goal #1: Pt will verbalize 3 ways of conserving energy during ADL to assist in recovery to PLOF  OT Frequency: Min 2X/week   Barriers to D/C:            Co-evaluation              AM-PAC OT "6 Clicks" Daily Activity     Outcome Measure Help from another person eating meals?: None Help from another person taking care of personal grooming?: A Little Help from another person toileting, which includes using toliet, bedpan, or urinal?: A Little Help from another person bathing (including washing, rinsing, drying)?: A Little Help from another person to put on and taking off regular upper body clothing?: None Help from another person to put on and taking off regular lower body  clothing?: A Little 6 Click Score: 20   End of Session Nurse Communication: Mobility status  Activity Tolerance: Patient tolerated treatment well Patient left: in bed;with call bell/phone within reach  OT Visit Diagnosis: Other abnormalities of gait and mobility (R26.89);Muscle weakness (generalized) (M62.81)                Time: 0737-1062 OT Time Calculation (min): 24 min Charges:  OT General Charges $OT Visit: 1 Visit OT Evaluation $OT Eval Moderate Complexity: 1 Mod OT Treatments $Self Care/Home Management : 8-22 mins  Hulda Humphrey OTR/L Acute Rehabilitation Services Pager: [phone] Office: [place] 07/11/2019, 5:02 PM

## 2019-07-12 LAB — CBC WITH DIFFERENTIAL/PLATELET
Abs Immature Granulocytes: 0.11 10*3/uL — ABNORMAL HIGH (ref 0.00–0.07)
Basophils Absolute: 0.1 10*3/uL (ref 0.0–0.1)
Basophils Relative: 1 %
Eosinophils Absolute: 0 10*3/uL (ref 0.0–0.5)
Eosinophils Relative: 0 %
HCT: 47.1 % (ref 39.0–52.0)
Hemoglobin: 15.2 g/dL (ref 13.0–17.0)
Immature Granulocytes: 1 %
Lymphocytes Relative: 15 %
Lymphs Abs: 1.3 10*3/uL (ref 0.7–4.0)
MCH: 27.6 pg (ref 26.0–34.0)
MCHC: 32.3 g/dL (ref 30.0–36.0)
MCV: 85.5 fL (ref 80.0–100.0)
Monocytes Absolute: 0.6 10*3/uL (ref 0.1–1.0)
Monocytes Relative: 7 %
Neutro Abs: 6.3 10*3/uL (ref 1.7–7.7)
Neutrophils Relative %: 76 %
Platelets: 309 10*3/uL (ref 150–400)
RBC: 5.51 MIL/uL (ref 4.22–5.81)
RDW: 14.2 % (ref 11.5–15.5)
WBC: 8.3 10*3/uL (ref 4.0–10.5)
nRBC: 0 % (ref 0.0–0.2)

## 2019-07-12 LAB — COMPREHENSIVE METABOLIC PANEL
ALT: 121 U/L — ABNORMAL HIGH (ref 0–44)
AST: 61 U/L — ABNORMAL HIGH (ref 15–41)
Albumin: 3 g/dL — ABNORMAL LOW (ref 3.5–5.0)
Alkaline Phosphatase: 47 U/L (ref 38–126)
Anion gap: 10 (ref 5–15)
BUN: 17 mg/dL (ref 6–20)
CO2: 26 mmol/L (ref 22–32)
Calcium: 8.8 mg/dL — ABNORMAL LOW (ref 8.9–10.3)
Chloride: 102 mmol/L (ref 98–111)
Creatinine, Ser: 0.62 mg/dL (ref 0.61–1.24)
GFR calc Af Amer: >60 mL/min (ref >60)
GFR calc non Af Amer: >60 mL/min (ref >60)
Glucose, Bld: 157 mg/dL — ABNORMAL HIGH (ref 70–99)
Potassium: 4.3 mmol/L (ref 3.5–5.1)
Sodium: 138 mmol/L (ref 135–145)
Total Bilirubin: 0.6 mg/dL (ref 0.3–1.2)
Total Protein: 6.8 g/dL (ref 6.5–8.1)

## 2019-07-12 LAB — FERRITIN: Ferritin: 338 ng/mL — ABNORMAL HIGH (ref 24–336)

## 2019-07-12 LAB — D-DIMER, QUANTITATIVE: D-Dimer, Quant: 0.49 ug/mL-FEU (ref 0.00–0.50)

## 2019-07-12 LAB — C-REACTIVE PROTEIN: CRP: 0.8 mg/dL (ref <1.0)

## 2019-07-12 MED ORDER — ASCORBIC ACID 500 MG PO TABS: 500.0000 mg | ORAL_TABLET | Freq: Every day | ORAL | 0 refills | Status: AC

## 2019-07-12 MED ORDER — FAMOTIDINE 20 MG PO TABS: 20.0000 mg | ORAL_TABLET | Freq: Every day | ORAL | 0 refills | Status: DC

## 2019-07-12 MED ORDER — BENZONATATE 100 MG PO CAPS: 100.0000 mg | ORAL_CAPSULE | Freq: Three times a day (TID) | ORAL | 0 refills | Status: AC | PRN

## 2019-07-12 MED ORDER — ALBUTEROL SULFATE HFA 108 (90 BASE) MCG/ACT IN AERS: 2.0000 | INHALATION_SPRAY | Freq: Four times a day (QID) | RESPIRATORY_TRACT | 0 refills | Status: AC | PRN

## 2019-07-12 MED ORDER — DEXAMETHASONE 2 MG PO TABS: ORAL_TABLET | ORAL | 0 refills | Status: AC

## 2019-07-12 NOTE — Discharge Summary (Signed)
[facility]  Physician Discharge Summary   Patient ID: Marcheta GrammesFahmi Malacara MRN: [id number] DOB/AGE: Aug 13, 1975 43 y.o.  Admit date: 07/10/2019 Discharge date: 07/13/2019  PCP: Patient, No Pcp Per  DISCHARGE DIAGNOSES:  Pneumonia due to COVID-19 Acute respiratory failure with hypoxia, resolved Hypokalemia, resolved  RECOMMENDATIONS FOR OUTPATIENT FOLLOW UP: 1. Follow-up with outpatient providers    Home Health: None Equipment/Devices: None  CODE STATUS: Full code  DISCHARGE CONDITION: fair  Diet recommendation: As before  INITIAL HISTORY: 43 y.o.malewithno significant past medical history who presented with dyspnea. He was admitted to [facility] on December 6 with a diagnosis of SARS COVID-19 viral pneumonia.  Patient was noted to be hypoxic.  He was started on steroids and remdesivir.  Transferred to this facility for further management.   HOSPITAL COURSE:   Acute Hypoxic Resp. Failure/Pneumonia due to COVID-19 Patient was hospitalized.  He was noted to be hypoxic.  He was started on steroids and remdesivir.  He slowly started improving.  His inflammatory markers improved.  He was slowly weaned off of oxygen.  He was mobilized.  Feels much better today.  Still anxious.  Patient was reassured.  Okay for discharge today.  Steroid taper.  Hypokalemia Resolved.  Obesity Estimated body mass index is 42.05 kg/m as calculated from the following:   Height as of this encounter: 5\' 5"  (1.651 m).   Weight as of this encounter: 114.6 kg.  Overall patient is stable.  Okay for discharge home today.  PERTINENT LABS:  The results of significant diagnostics from this hospitalization (including imaging, microbiology, ancillary and laboratory) are listed below for reference.    Microbiology: Recent Results (from the past 240 hour(s))  Culture, blood (x 2)     Status: None   Collection Time: 07/08/19  3:53 PM   Specimen: BLOOD  Result Value Ref  Range Status   Specimen Description BLOOD BLOOD RIGHT HAND  Final   Special Requests   Final    BOTTLES DRAWN AEROBIC AND ANAEROBIC Blood Culture adequate volume   Culture   Final    NO GROWTH 5 DAYS Performed at [facility], [address] [place], [place] [address]    Report Status 07/13/2019 FINAL  Final  Culture, blood (x 2)     Status: None   Collection Time: 07/08/19  3:53 PM   Specimen: BLOOD  Result Value Ref Range Status   Specimen Description BLOOD LEFT ANTECUBITAL  Final   Special Requests   Final    BOTTLES DRAWN AEROBIC AND ANAEROBIC Blood Culture results may not be optimal due to an excessive volume of blood received in culture bottles   Culture   Final    NO GROWTH 5 DAYS Performed at [facility], [address] [place], [place] [address]    Report Status 07/13/2019 FINAL  Final     Labs:  COVID-19 Labs  Recent Labs    07/11/19 0405 07/12/19 0440  DDIMER 0.65* 0.49  FERRITIN 416* 338*  CRP 1.2* 0.8      Basic Metabolic Panel: Recent Labs  Lab 07/08/19 1146 07/09/19 0641 07/10/19 0602 07/10/19 1710 07/11/19 0405 07/12/19 0440  NA 137 138 139  --  140 138  K 3.4* 3.9 3.9  --  4.3 4.3  CL 105 108 107  --  102 102  CO2 23 22 24   --  26 26  GLUCOSE 136* 184* 162*  --  136* 157*  BUN 11 10 11   --  14 17  CREATININE 0.77 0.53* 0.56* 0.58* 0.61 0.62  CALCIUM 8.2* 8.1* 8.2*  --  8.8* 8.8*  MG 2.1  --   --   --   --   --    Liver Function Tests: Recent Labs  Lab 07/08/19 1146 07/09/19 0641 07/10/19 0602 07/11/19 0405 07/12/19 0440  AST 52* 45* 33 63* 61*  ALT 52* 55* 53* 107* 121*  ALKPHOS 48 44 42 48 47  BILITOT 0.7 0.6 0.6 0.8 0.6  PROT 7.7 6.8 6.4* 6.9 6.8  ALBUMIN 3.3* 2.9* 2.6* 3.1* 3.0*   Recent Labs  Lab 07/08/19 1146  LIPASE 38   CBC: Recent Labs  Lab 07/08/19 1146 07/09/19 0641 07/10/19 0602 07/10/19 1710 07/12/19 0440  WBC 6.1 3.9* 9.8 9.6 8.3  NEUTROABS 4.4 3.0 8.1*  --  6.3  HGB 14.2 13.3  13.4 14.5 15.2  HCT 43.1 39.4 40.4 45.5 47.1  MCV 83.9 81.1 81.6 85.0 85.5  PLT 184 184 230 261 309    CBG: Recent Labs  Lab 07/10/19 0819  GLUCAP 150*     IMAGING STUDIES DG Chest Portable 1 View  Result Date: 07/08/2019 CLINICAL DATA:  Increasing shortness of breath positive COVID test last Wednesday EXAM: PORTABLE CHEST 1 VIEW COMPARISON:  None FINDINGS: Cardiomediastinal contours are normal. Lungs with nodular and ground-glass opacities with basilar predominance. No signs of dense consolidation or evidence of pleural effusion. IMPRESSION: Findings of atypical or viral pneumonia, could certainly be seen in the setting of COVID-19 infection. No dense consolidation or pleural effusion. Electronically Signed   By: Zetta Bills M.D.   On: 07/08/2019 13:12    DISCHARGE EXAMINATION: Vitals:   07/11/19 1921 07/12/19 0359 07/12/19 0826 07/12/19 0900  BP: 106/69 (!) 107/59 98/67 109/69  Pulse: (!) 55 (!) 57 (!) 50   Resp: 20 18 16 18   Temp: 97.9 F (36.6 C) 97.9 F (36.6 C) 97.8 F (36.6 C) 97.9 F (36.6 C)  TempSrc: Oral Oral Oral Oral  SpO2: 94% 92% 93% 93%  Weight:      Height:       General appearance: Awake alert.  In no distress Resp: Normal effort.  Improved air entry bilaterally with few crackles at the bases.  No wheezing or rhonchi. Cardio: S1-S2 is normal regular.  No S3-S4.  No rubs murmurs or bruit GI: Abdomen is soft.  Nontender nondistended.  Bowel sounds are present normal.  No masses organomegaly Extremities: No edema.  Full range of motion of lower extremities. Neurologic: Alert and oriented x3.  No focal neurological deficits.    DISPOSITION: Home  Discharge Instructions    Call MD for:  difficulty breathing, headache or visual disturbances   Complete by: As directed    Call MD for:  extreme fatigue   Complete by: As directed    Call MD for:  persistant dizziness or light-headedness   Complete by: As directed    Call MD for:  persistant nausea and  vomiting   Complete by: As directed    Call MD for:  severe uncontrolled pain   Complete by: As directed    Call MD for:  temperature >100.4   Complete by: As directed    Discharge instructions   Complete by: As directed    Please take your medications as prescribed.  Follow-up with your primary care provider as arranged.  COVID 19 INSTRUCTIONS  - You are felt to be stable enough to no longer require inpatient monitoring, testing, and  treatment, though you will need to follow the recommendations below: - Based on the CDC's non-test criteria for ending self-isolation: You may not return to work/leave the home until at least 21 days since symptom onset AND 3 days without a fever (without taking tylenol, ibuprofen, etc.) AND have improvement in respiratory symptoms. - Do not take NSAID medications (including, but not limited to, ibuprofen, advil, motrin, naproxen, aleve, goody's powder, etc.) - Follow up with your doctor in the next week via telehealth or seek medical attention right away if your symptoms get WORSE.  - Consider donating plasma after you have recovered (either 14 days after a negative test or 28 days after symptoms have completely resolved) because your antibodies to this virus may be helpful to give to others with life-threatening infections. Please go to the website www.[email or web address] if you would like to consider volunteering for plasma donation.    Directions for you at home:  Wear a facemask You should wear a facemask that covers your nose and mouth when you are in the same room with other people and when you visit a healthcare provider. People who live with or visit you should also wear a facemask while they are in the same room with you.  Separate yourself from other people in your home As much as possible, you should stay in a different room from other people in your home. Also, you should use a separate bathroom, if available.  Avoid sharing household items You  should not share dishes, drinking glasses, cups, eating utensils, towels, bedding, or other items with other people in your home. After using these items, you should wash them thoroughly with soap and water.  Cover your coughs and sneezes Cover your mouth and nose with a tissue when you cough or sneeze, or you can cough or sneeze into your sleeve. Throw used tissues in a lined trash can, and immediately wash your hands with soap and water for at least 20 seconds or use an alcohol-based hand rub.  Wash your [employer] your hands often and thoroughly with soap and water for at least 20 seconds. You can use an alcohol-based hand sanitizer if soap and water are not available and if your hands are not visibly dirty. Avoid touching your eyes, nose, and mouth with unwashed hands.  Directions for those who live with, or provide care at home for you:  Limit the number of people who have contact with the patient If possible, have only one caregiver for the patient. Other household members should stay in another home or place of residence. If this is not possible, they should stay in another room, or be separated from the patient as much as possible. Use a separate bathroom, if available. Restrict visitors who do not have an essential need to be in the home.  Ensure good ventilation Make sure that shared spaces in the home have good air flow, such as from an air conditioner or an opened window, weather permitting.  Wash your hands often Wash your hands often and thoroughly with soap and water for at least 20 seconds. You can use an alcohol based hand sanitizer if soap and water are not available and if your hands are not visibly dirty. Avoid touching your eyes, nose, and mouth with unwashed hands. Use disposable paper towels to dry your hands. If not available, use dedicated cloth towels and replace them when they become wet.  Wear a facemask and gloves Wear a disposable facemask at all times in  the room and gloves when you touch or have contact with the patients blood, body fluids, and/or secretions or excretions, such as sweat, saliva, sputum, nasal mucus, vomit, urine, or feces.  Ensure the mask fits over your nose and mouth tightly, and do not touch it during use. Throw out disposable facemasks and gloves after using them. Do not reuse. Wash your hands immediately after removing your facemask and gloves. If your personal clothing becomes contaminated, carefully remove clothing and launder. Wash your hands after handling contaminated clothing. Place all used disposable facemasks, gloves, and other waste in a lined container before disposing them with other household waste. Remove gloves and wash your hands immediately after handling these items.  Do not share dishes, glasses, or other household items with the patient Avoid sharing household items. You should not share dishes, drinking glasses, cups, eating utensils, towels, bedding, or other items with a patient who is confirmed to have, or being evaluated for, COVID-19 infection. After the person uses these items, you should wash them thoroughly with soap and water.  Wash laundry thoroughly Immediately remove and wash clothes or bedding that have blood, body fluids, and/or secretions or excretions, such as sweat, saliva, sputum, nasal mucus, vomit, urine, or feces, on them. Wear gloves when handling laundry from the patient. Read and follow directions on labels of laundry or clothing items and detergent. In general, wash and dry with the warmest temperatures recommended on the label.  Clean all areas the individual has used often Clean all touchable surfaces, such as counters, tabletops, doorknobs, bathroom fixtures, toilets, phones, keyboards, tablets, and bedside tables, every day. Also, clean any surfaces that may have blood, body fluids, and/or secretions or excretions on them. Wear gloves when cleaning surfaces the patient has  come in contact with. Use a diluted bleach solution (e.g., dilute bleach with 1 part bleach and 10 parts water) or a household disinfectant with a label that says EPA-registered for coronaviruses. To make a bleach solution at home, add 1 tablespoon of bleach to 1 quart (4 cups) of water. For a larger supply, add  cup of bleach to 1 gallon (16 cups) of water. Read labels of cleaning products and follow recommendations provided on product labels. Labels contain instructions for safe and effective use of the cleaning product including precautions you should take when applying the product, such as wearing gloves or eye protection and making sure you have good ventilation during use of the product. Remove gloves and wash hands immediately after cleaning.  Monitor yourself for signs and symptoms of illness Caregivers and household members are considered close contacts, should monitor their health, and will be asked to limit movement outside of the home to the extent possible. Follow the monitoring steps for close contacts listed on the symptom monitoring form.   If you have additional questions, contact your local health department or call the epidemiologist on call at [phone] (available 24/7). This guidance is subject to change. For the most up-to-date guidance from [facility], please refer to their website: TripMetro.hu   You were cared for by a hospitalist during your hospital stay. If you have any questions about your discharge medications or the care you received while you were in the hospital after you are discharged, you can call the unit and asked to speak with the hospitalist on call if the hospitalist that took care of you is not available. Once you are discharged, your primary care physician will handle any further medical issues. Please note that NO REFILLS  for any discharge medications will be authorized once you are discharged, as  it is imperative that you return to your primary care physician (or establish a relationship with a primary care physician if you do not have one) for your aftercare needs so that they can reassess your need for medications and monitor your lab values. If you do not have a primary care physician, you can call [phone] for a physician referral.   Increase activity slowly   Complete by: As directed         Allergies as of 07/12/2019   No Known Allergies     Medication List    TAKE these medications   albuterol 108 (90 Base) MCG/ACT inhaler Commonly known as: VENTOLIN HFA Inhale 2 puffs into the lungs every 6 (six) hours as needed for wheezing or shortness of breath.   ascorbic acid 500 MG tablet Commonly known as: VITAMIN C Take 1 tablet (500 mg total) by mouth daily for 10 days. Notes to patient: Next: 12/11 AM   benzonatate 100 MG capsule Commonly known as: Tessalon Perles Take 1 capsule (100 mg total) by mouth 3 (three) times daily as needed for cough.   dexamethasone 2 MG tablet Commonly known as: DECADRON Take 2 tablets once daily for 3 days, then 1 tablet once daily for 3 days, then STOP. Notes to patient: Next: 12/11 AM   famotidine 20 MG tablet Commonly known as: PEPCID Take 1 tablet (20 mg total) by mouth daily for 10 days. Notes to patient: Next: 12/11 AM        Follow-up Information    [facility] Follow up.   Why: You are scheduled for a telephonic hospital follow up appointment on Thursday, 07/19/19 at 2:10pm. Please be available for this appointment. Should you need to reschedule please call [phone]. Contact information: [address] [place] [address] [phone]          TOTAL DISCHARGE TIME: 35 minutes  Albi Rappaport Rito Ehrlich  [facility] Pager on [email or web address]  07/13/2019, 3:40 PM

## 2019-07-12 NOTE — Discharge Instructions (Signed)

## 2019-07-12 NOTE — Progress Notes (Signed)
1500Pt given discharge instructions, pt verbalized discharge instructions, pt VSS. Pt denies any chest pain or shortness of breath.  X2 IV removed cannulas in tact

## 2019-07-13 LAB — CULTURE, BLOOD (ROUTINE X 2)
Culture: NO GROWTH
Culture: NO GROWTH
Special Requests: ADEQUATE

## 2019-07-18 NOTE — Progress Notes (Deleted)
Patient ID: Jacob Gardner, male   DOB: 1976/04/19, 43 y.o.   MRN: [id number]   Virtual Visit via Telephone Note  I connected with Jacob Gardner on 07/19/19 at  2:10 PM EST by telephone and verified that I am speaking with the correct person using two identifiers.   I discussed the limitations, risks, security and privacy concerns of performing an evaluation and management service by telephone and the availability of in person appointments. I also discussed with the patient that there may be a patient responsible charge related to this service. The patient expressed understanding and agreed to proceed.  Patient location:  home My Location:  [facility] office Persons on the call:  Me and the patient   History of Present Illness: After hospitalization from 12/8-12/11 for Covid 19.  From discharge summary: Admit date: 07/10/2019 Discharge date: 07/13/2019  PCP: Patient, No Pcp Per  DISCHARGE DIAGNOSES:  Pneumonia due to COVID-19 Acute respiratory failure with hypoxia, resolved Hypokalemia, resolved  RECOMMENDATIONS FOR OUTPATIENT FOLLOW UP: 1. Follow-up with outpatient providers    Home Health: None Equipment/Devices: None  CODE STATUS: Full code  DISCHARGE CONDITION: fair  Diet recommendation: As before  INITIAL HISTORY: 43 y.o.malewithno significant past medical history who presented with dyspnea. He was admitted to [facility] on December 6 with a diagnosis of SARS COVID-19 viral pneumonia.Patient was noted to be hypoxic. He was started on steroids and remdesivir. Transferred to this facility for further management.   HOSPITAL COURSE:   Acute Hypoxic Resp. Failure/Pneumonia due to COVID-19 Patient was hospitalized.  He was noted to be hypoxic.  He was started on steroids and remdesivir.  He slowly started improving.  His inflammatory markers improved.  He was slowly weaned off of oxygen.  He was mobilized.  Feels much better today.  Still  anxious.  Patient was reassured.  Okay for discharge today.  Steroid taper.  Hypokalemia Resolved.  Obesity Estimated body mass index is 42.05 kg/m as calculated from the following: Height as of this encounter: 5\' 5"  (1.651 m). Weight as of this encounter: 114.6 kg.    Observations/Objective:   Assessment and Plan:   Follow Up Instructions:    I discussed the assessment and treatment plan with the patient. The patient was provided an opportunity to ask questions and all were answered. The patient agreed with the plan and demonstrated an understanding of the instructions.   The patient was advised to call back or seek an in-person evaluation if the symptoms worsen or if the condition fails to improve as anticipated.  I provided *** minutes of non-face-to-face time during this encounter.   Freeman Caldron, PA-C

## 2019-07-19 ENCOUNTER — Ambulatory Visit: Payer: Self-pay | Attending: Family Medicine | Admitting: Physician Assistant

## 2019-07-19 ENCOUNTER — Other Ambulatory Visit: Payer: Self-pay

## 2019-08-29 ENCOUNTER — Other Ambulatory Visit: Payer: Self-pay

## 2021-01-17 ENCOUNTER — Other Ambulatory Visit: Payer: Self-pay

## 2021-01-17 ENCOUNTER — Emergency Department: Payer: 59

## 2021-01-17 ENCOUNTER — Encounter: Payer: Self-pay | Admitting: Emergency Medicine

## 2021-01-17 ENCOUNTER — Emergency Department
Admission: EM | Admit: 2021-01-17 | Discharge: 2021-01-17 | Disposition: A | Discharge status: Home / Self Care | Payer: 59 | Attending: Emergency Medicine | Admitting: Emergency Medicine

## 2021-01-17 DIAGNOSIS — Z8616 Personal history of COVID-19: Secondary | ICD-10-CM | POA: Diagnosis not present

## 2021-01-17 DIAGNOSIS — R1013 Epigastric pain: Secondary | ICD-10-CM | POA: Diagnosis not present

## 2021-01-17 DIAGNOSIS — F17200 Nicotine dependence, unspecified, uncomplicated: Secondary | ICD-10-CM | POA: Diagnosis not present

## 2021-01-17 DIAGNOSIS — R112 Nausea with vomiting, unspecified: Secondary | ICD-10-CM | POA: Diagnosis not present

## 2021-01-17 DIAGNOSIS — R1011 Right upper quadrant pain: Secondary | ICD-10-CM | POA: Insufficient documentation

## 2021-01-17 LAB — URINALYSIS, COMPLETE (UACMP) WITH MICROSCOPIC
Bacteria, UA: NONE SEEN
Bilirubin Urine: NEGATIVE
Glucose, UA: NEGATIVE mg/dL
Ketones, ur: NEGATIVE mg/dL
Leukocytes,Ua: NEGATIVE
Nitrite: NEGATIVE
Protein, ur: NEGATIVE mg/dL
Specific Gravity, Urine: 1.024 (ref 1.005–1.030)
pH: 5 (ref 5.0–8.0)

## 2021-01-17 LAB — CBC
HCT: 45 % (ref 39.0–52.0)
Hemoglobin: 15 g/dL (ref 13.0–17.0)
MCH: 28.1 pg (ref 26.0–34.0)
MCHC: 33.3 g/dL (ref 30.0–36.0)
MCV: 84.3 fL (ref 80.0–100.0)
Platelets: 326 10*3/uL (ref 150–400)
RBC: 5.34 MIL/uL (ref 4.22–5.81)
RDW: 14.6 % (ref 11.5–15.5)
WBC: 10.2 10*3/uL (ref 4.0–10.5)
nRBC: 0 % (ref 0.0–0.2)

## 2021-01-17 LAB — COMPREHENSIVE METABOLIC PANEL
ALT: 30 U/L (ref 0–44)
AST: 21 U/L (ref 15–41)
Albumin: 4 g/dL (ref 3.5–5.0)
Alkaline Phosphatase: 68 U/L (ref 38–126)
Anion gap: 8 (ref 5–15)
BUN: 15 mg/dL (ref 6–20)
CO2: 27 mmol/L (ref 22–32)
Calcium: 9.2 mg/dL (ref 8.9–10.3)
Chloride: 101 mmol/L (ref 98–111)
Creatinine, Ser: 0.68 mg/dL (ref 0.61–1.24)
GFR, Estimated: >60 mL/min (ref >60)
Glucose, Bld: 160 mg/dL — ABNORMAL HIGH (ref 70–99)
Potassium: 3.8 mmol/L (ref 3.5–5.1)
Sodium: 136 mmol/L (ref 135–145)
Total Bilirubin: 0.5 mg/dL (ref 0.3–1.2)
Total Protein: 8.2 g/dL — ABNORMAL HIGH (ref 6.5–8.1)

## 2021-01-17 LAB — TROPONIN I (HIGH SENSITIVITY): Troponin I (High Sensitivity): 3 ng/L (ref <18)

## 2021-01-17 LAB — LIPASE, BLOOD: Lipase: 34 U/L (ref 11–51)

## 2021-01-17 MED ORDER — MORPHINE SULFATE (PF) 4 MG/ML IV SOLN
4.0000 mg | Freq: Once | INTRAVENOUS | Status: AC
  Administered 2021-01-17: 4 mg via INTRAVENOUS
  Filled 2021-01-17: qty 1

## 2021-01-17 MED ORDER — FAMOTIDINE 20 MG PO TABS: 20.0000 mg | ORAL_TABLET | Freq: Two times a day (BID) | ORAL | 0 refills | Status: AC

## 2021-01-17 MED ORDER — ONDANSETRON HCL 4 MG/2ML IJ SOLN
4.0000 mg | Freq: Once | INTRAMUSCULAR | Status: AC
  Administered 2021-01-17: 4 mg via INTRAVENOUS
  Filled 2021-01-17: qty 2

## 2021-01-17 NOTE — ED Triage Notes (Signed)
Pt brought in by [facility] with c/o epigastric pain 10/10. He ate beef last night at 10pm. He did have hard BM about 3Am. He is cool and clammy at triage. He has nausea but unable to vomit.

## 2021-01-17 NOTE — ED Provider Notes (Signed)
[facility] Emergency Department Provider Note ____________________________________________   Event Date/Time   First MD Initiated Contact with Patient 01/17/21 [phone]     (approximate)  I have reviewed the triage vital signs and the nursing notes.   HISTORY  Chief Complaint Abdominal Pain    HPI Jacob Gardner is a 45 y.o. male with PMH as noted below who presents with epigastric and right upper quadrant abdominal pain, cute onset yesterday evening, persistent course since then, worsening in intensity.  It is associated with nausea and one episode of vomiting.  The patient has had no diarrhea or fever.  He states that the pain started shortly after he had eaten some meat.  He denies any prior history of this pain.  Past Medical History:  Diagnosis Date   Varicose vein of leg     Patient Active Problem List   Diagnosis Date Noted   Pneumonia due to COVID-19 virus 07/10/2019   Acute respiratory disease due to COVID-19 virus 07/08/2019   Hypokalemia 07/08/2019   COVID-19 virus infection 07/08/2019   Acute respiratory failure with hypoxia (HCC) 07/08/2019    No past surgical history on file.  Prior to Admission medications   Medication Sig Start Date End Date Taking? Authorizing Provider  famotidine (PEPCID) 20 MG tablet Take 1 tablet (20 mg total) by mouth 2 (two) times daily. 01/17/21  Yes Dionne Bucy, MD  albuterol (VENTOLIN HFA) 108 (90 Base) MCG/ACT inhaler Inhale 2 puffs into the lungs every 6 (six) hours as needed for wheezing or shortness of breath. 07/12/19   Osvaldo Shipper, MD  dexamethasone (DECADRON) 2 MG tablet Take 2 tablets once daily for 3 days, then 1 tablet once daily for 3 days, then STOP. 07/12/19   Osvaldo Shipper, MD    Allergies Patient has no known allergies.  Family History  Problem Relation Age of Onset   Diabetes Mellitus II Mother     Social History Social History   Tobacco Use   Smoking status: Smoker,  Current Status Unknown    Pack years: 0.00   Smokeless tobacco: Current  Substance Use Topics   Alcohol use: Never   Drug use: Never    Review of Systems  Constitutional: No fever. Eyes: No redness. ENT: No sore throat. Cardiovascular: Denies chest pain. Respiratory: Denies shortness of breath. Gastrointestinal: Positive for nausea and vomiting. Genitourinary: Negative for dysuria.  Musculoskeletal: Negative for back pain. Skin: Negative for rash. Neurological: Negative for headache.   ____________________________________________   PHYSICAL EXAM:  VITAL SIGNS: ED Triage Vitals  Enc Vitals Group     BP 01/17/21 0732 (!) 156/99     Pulse Rate 01/17/21 0732 71     Resp 01/17/21 0732 20     Temp 01/17/21 0732 97.8 F (36.6 C)     Temp Source 01/17/21 0732 Oral     SpO2 01/17/21 0732 99 %     Weight 01/17/21 0733 252 lb 10.4 oz (114.6 kg)     Height 01/17/21 0733 5\' 5"  (1.651 m)     Head Circumference --      Peak Flow --      Pain Score 01/17/21 0733 10     Pain Loc --      Pain Edu? --      Excl. in GC? --     Constitutional: Alert and oriented.  Relatively well appearing and in no acute distress. Eyes: Conjunctivae are normal.  No scleral icterus. Head: Atraumatic. Nose: No congestion/rhinnorhea. Mouth/Throat:  Mucous membranes are moist.   Neck: Normal range of motion.  Cardiovascular: Normal rate, regular rhythm. Good peripheral circulation. Respiratory: Normal respiratory effort.  No retractions.  Gastrointestinal: Soft with mild right upper quadrant tenderness.  No distention.  Genitourinary: No flank tenderness. Musculoskeletal: Extremities warm and well perfused.  Neurologic:  Normal speech and language. No gross focal neurologic deficits are appreciated.  Skin:  Skin is warm and dry. No rash noted. Psychiatric: Mood and affect are normal. Speech and behavior are normal.  ____________________________________________   LABS (all labs ordered are  listed, but only abnormal results are displayed)  Labs Reviewed  COMPREHENSIVE METABOLIC PANEL - Abnormal; Notable for the following components:      Result Value   Glucose, Bld 160 (*)    Total Protein 8.2 (*)    All other components within normal limits  URINALYSIS, COMPLETE (UACMP) WITH MICROSCOPIC - Abnormal; Notable for the following components:   Color, Urine YELLOW (*)    APPearance CLEAR (*)    Hgb urine dipstick SMALL (*)    All other components within normal limits  LIPASE, BLOOD  CBC  TROPONIN I (HIGH SENSITIVITY)   ____________________________________________  EKG  ED ECG REPORT I, Dionne Bucy, the attending physician, personally viewed and interpreted this ECG.  Date: 01/17/2021 EKG Time: 0735 Rate: 65 Rhythm: normal sinus rhythm QRS Axis: normal Intervals: normal ST/T Wave abnormalities: normal Narrative Interpretation: no evidence of acute ischemia   ____________________________________________  RADIOLOGY  US abdomen RUQ: Gallbladder sludge with no other acute abnormality CT abdomen/pelvis: No acute abnormality  ____________________________________________   PROCEDURES  Procedure(s) performed: No  Procedures  Critical Care performed: No ____________________________________________   INITIAL IMPRESSION / ASSESSMENT AND PLAN / ED COURSE  Pertinent labs & imaging results that were available during my care of the patient were reviewed by me and considered in my medical decision making (see chart for details).   45 year old male with no active medical problems presents with epigastric and right upper quadrant abdominal pain since last night after eating some meat.  He has had nausea and one episode of vomiting but no other acute symptoms.  He has no history of prior abdominal surgeries.  On exam, he is overall well-appearing.  His vital signs are normal except for hypertension.  He has tenderness in the right upper quadrant.  Exam is  otherwise unremarkable.  Differential includes biliary colic, acute cholecystitis, pancreatitis, other hepatobiliary cause, gastritis, PUD, gastroparesis, colitis, diverticulitis.  We will obtain labs and a right upper quadrant ultrasound and reassess.  ----------------------------------------- 11:25 AM on 01/17/2021 -----------------------------------------  Lab work-up is entirely within normal limits.  The right upper quadrant ultrasound shows some gallbladder sludge but no other acute abnormalities.  I do not suspect biliary colic.  I ordered a CT to further evaluate, however this is also negative for acute findings.  The patient has now had no pain for the last several hours.  He feels much better.  Overall I suspect most likely gastritis versus early gastroenteritis.  I counseled the patient on the results of the work-up.  He is stable for discharge home.  Return precautions given, and he expresses understanding. ____________________________________________   FINAL CLINICAL IMPRESSION(S) / ED DIAGNOSES  Final diagnoses:  Right upper quadrant abdominal pain      NEW MEDICATIONS STARTED DURING THIS VISIT:  New Prescriptions   FAMOTIDINE (PEPCID) 20 MG TABLET    Take 1 tablet (20 mg total) by mouth 2 (two) times daily.  Note:  This document was prepared using Dragon voice recognition software and may include unintentional dictation errors.    Dionne Bucy, MD 01/17/21 1126

## 2021-01-17 NOTE — ED Notes (Signed)
US at bedside

## 2021-01-17 NOTE — ED Notes (Signed)
Pt via EMS from home. Pt c/o epigastric pain since yesterday around 9:00pm after eating, pt states that he thinks it is acid reflux. Pt also c/o nausea and had 1 episode of vomiting this AM. Denies diarrhea. Denies fever. Pt is A&Ox4 and NAD.

## 2021-10-28 IMAGING — US US ABDOMEN LIMITED
1 series · 14 of 25 positions shown · non-contrast
Comparison: None.

CLINICAL DATA: 45-year-old male with right upper quadrant pain for
12 hours.

EXAM:
ULTRASOUND ABDOMEN LIMITED RIGHT UPPER QUADRANT

[Series 1: us abdomen limited ruq (liver/gb) · 14 of 33 slices shown]
[im 1/33]
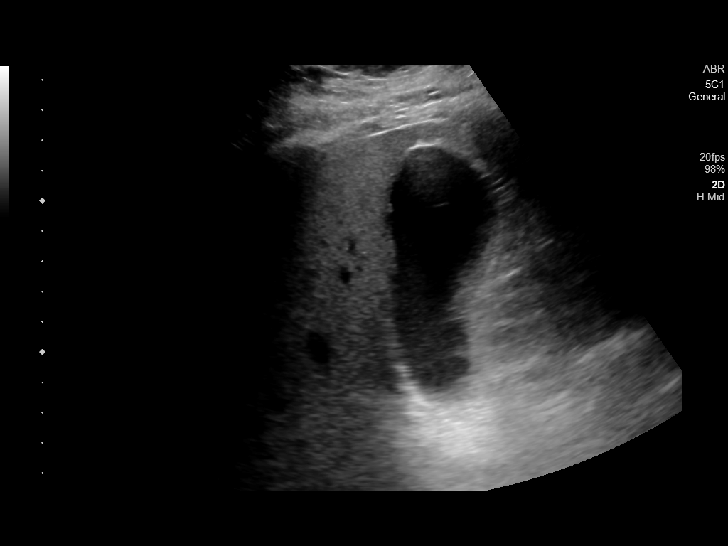
[im 3/33]
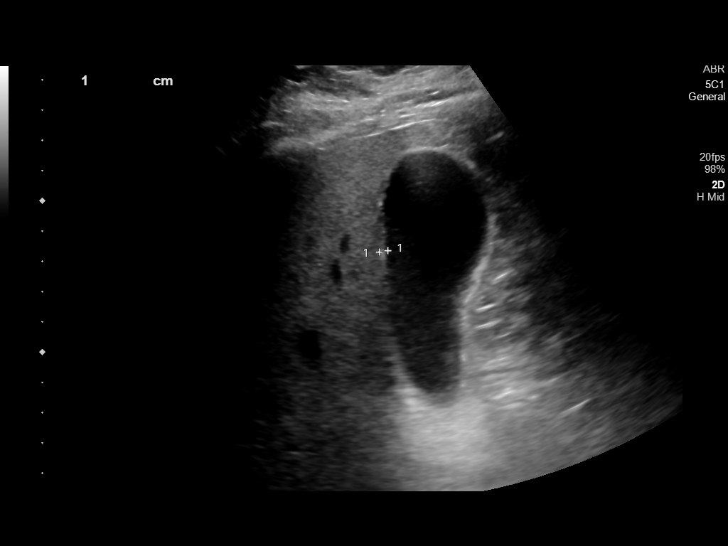
[im 6/33]
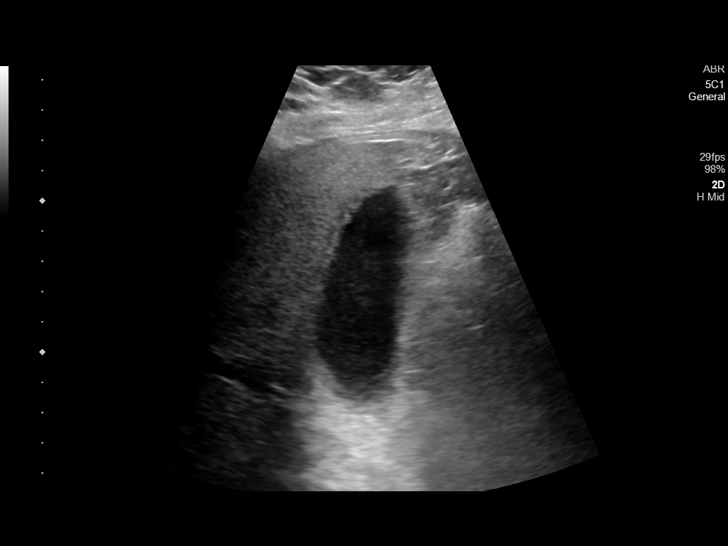
[im 9/33]
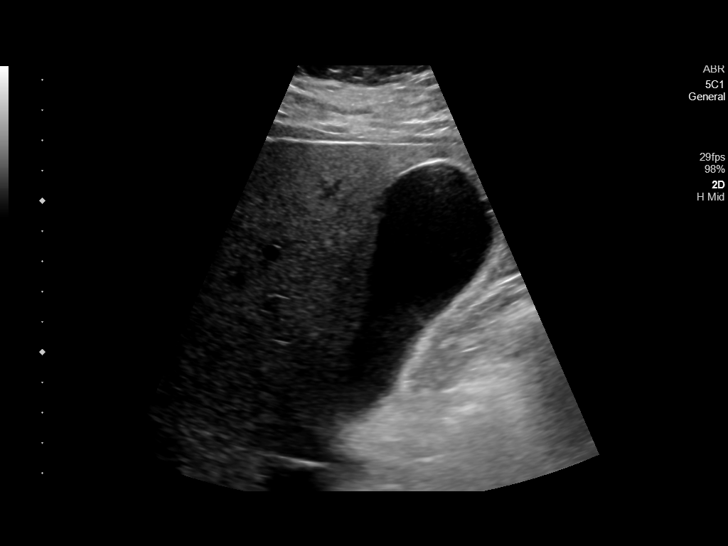
[im 11/33]
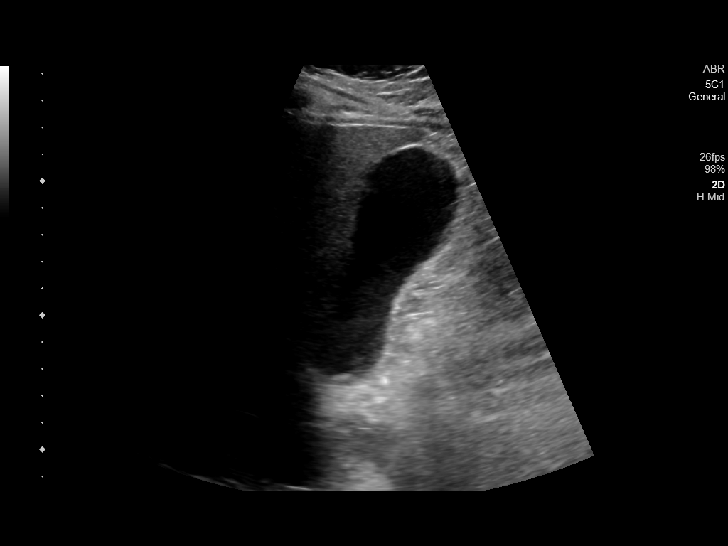
[im 13/33]
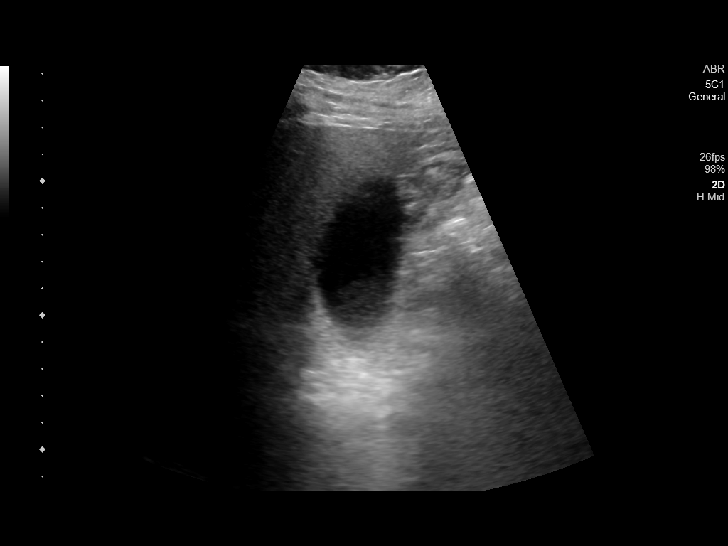
[im 15/33]
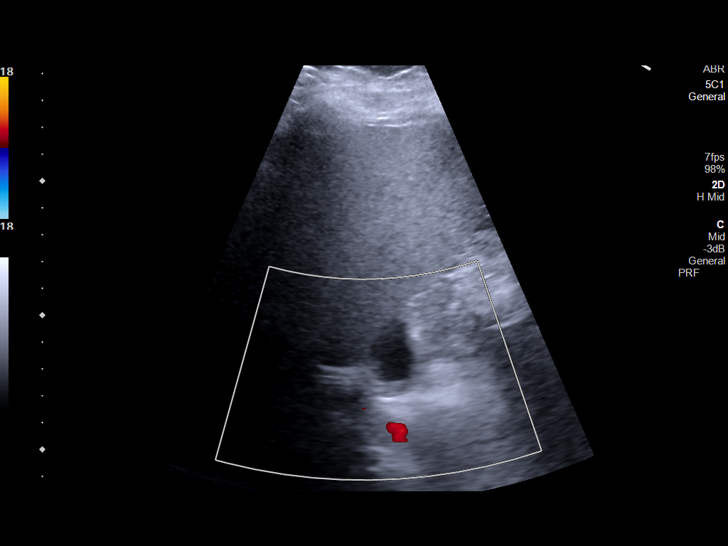
[im 18/33]
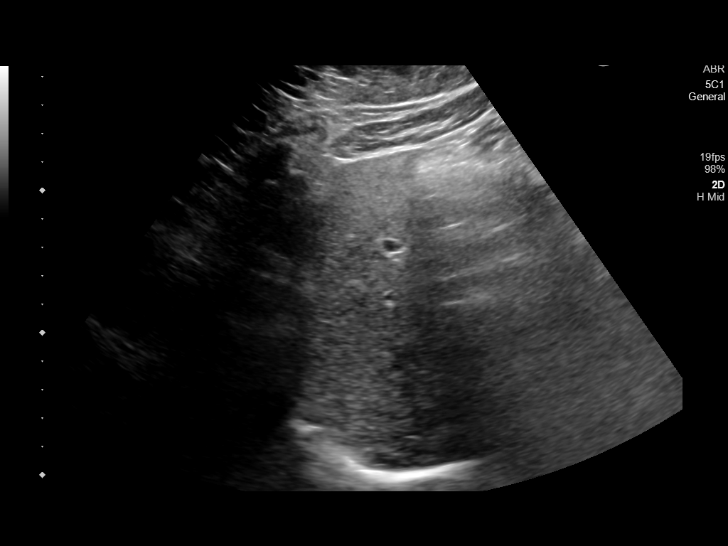
[im 21/33]
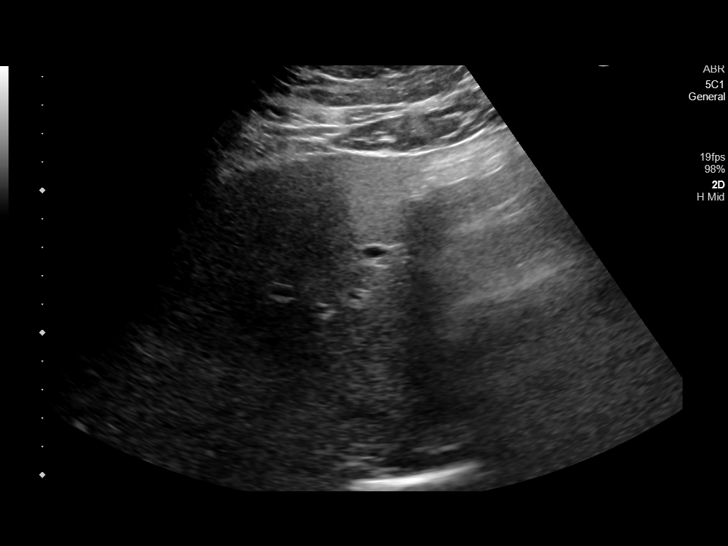
[im 22/33]
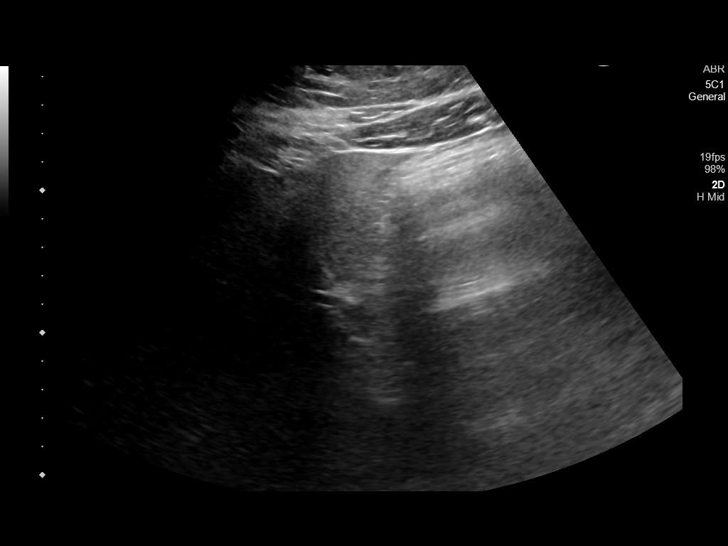
[im 25/33]
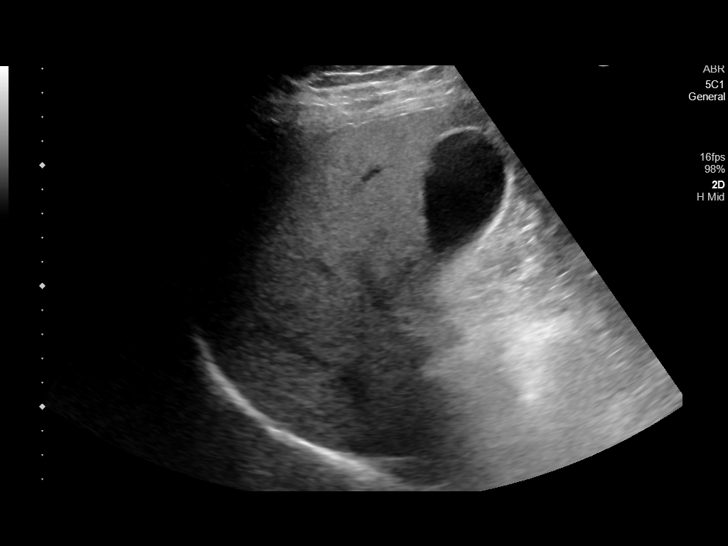
[im 27/33]
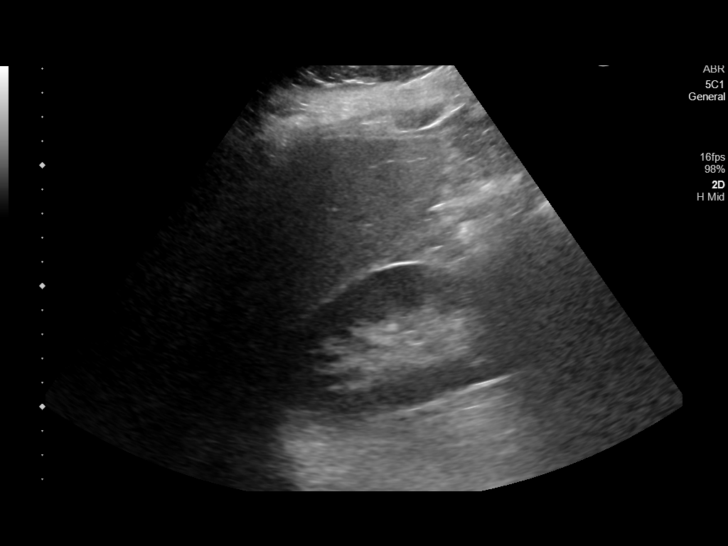
[im 30/33]
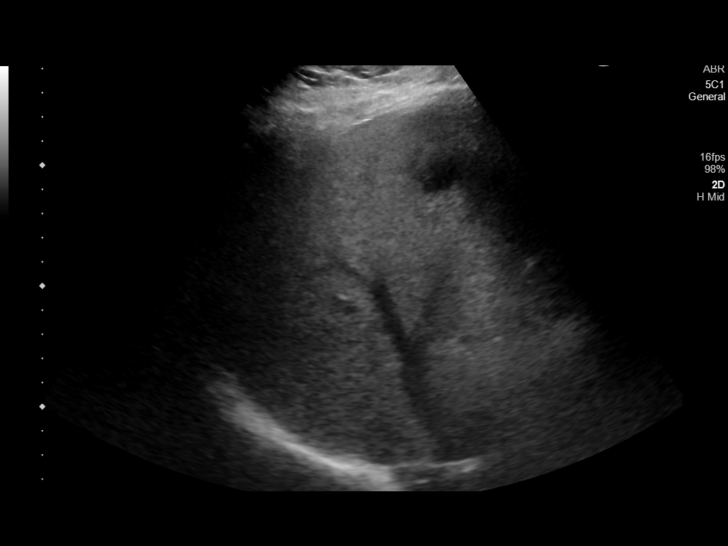
[im 33/33]
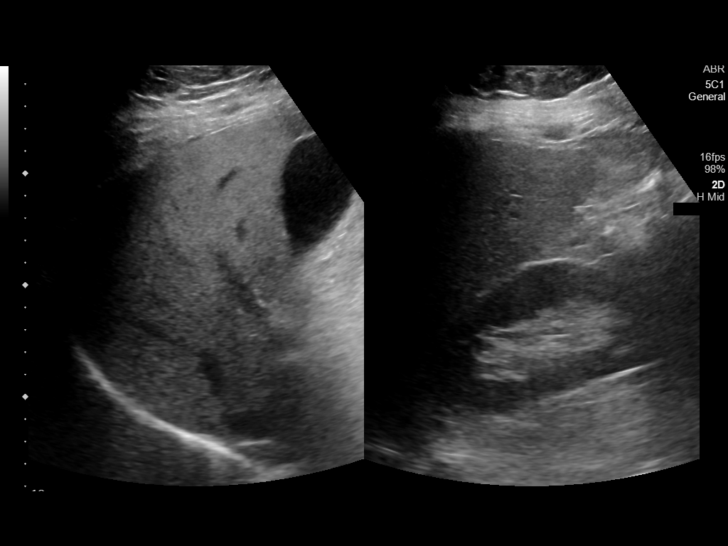

[14 of 25 positions shown; findings below may reference images not displayed]

FINDINGS: Gallbladder:

Dependent sludge within the gallbladder (image 3). Gallbladder wall
thickness remains normal at 2 to 3 mm. No stones identified. No
pericholecystic fluid. No sonographic Murphy sign elicited.

Common bile duct:

Diameter: 2 mm, normal.

Liver:

Mildly echogenic liver (image 33). No discrete liver lesion. Portal
vein is patent on color Doppler imaging with normal direction of
blood flow towards the liver.

Other: Negative visible right kidney.  No free fluid.
IMPRESSION: 1. Gallbladder sludge, but no gallstones, evidence of acute
cholecystitis, or bile duct obstruction.
2. Evidence of fatty liver disease.

## 2021-10-28 IMAGING — CT CT ABD-PELV W/O CM
2 of 4 series · 16 of 46 positions shown, 18 images · non-contrast
Comparison: None.

CLINICAL DATA: Epigastric pain beginning yesterday. Nausea and
vomiting.

EXAM:
CT ABDOMEN AND PELVIS WITHOUT CONTRAST
TECHNIQUE: Multidetector CT imaging of the abdomen and pelvis was performed
following the standard protocol without IV contrast.

[Series 2: routine abd/pel wo · axial · 0.77mm/px · z∈[-927,-447]mm · 13 of 106 slices shown, 15 images]
[im 5/106  soft-tissue]
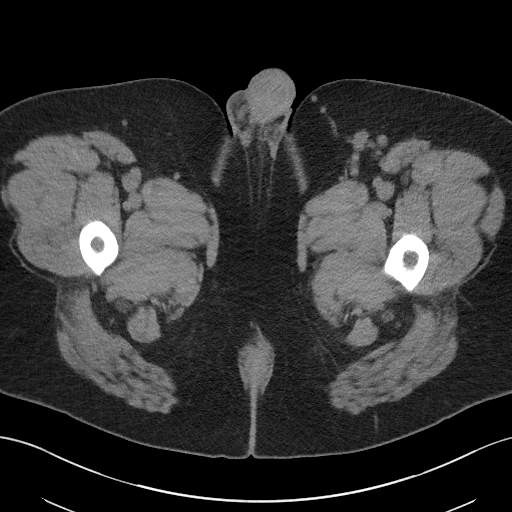
[im 5/106  bone]
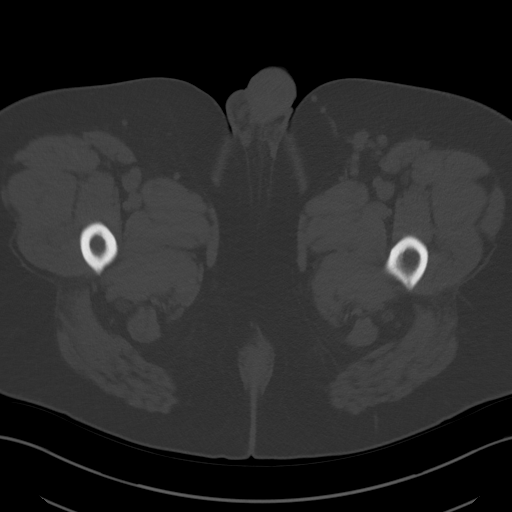
[im 14/106  soft-tissue]
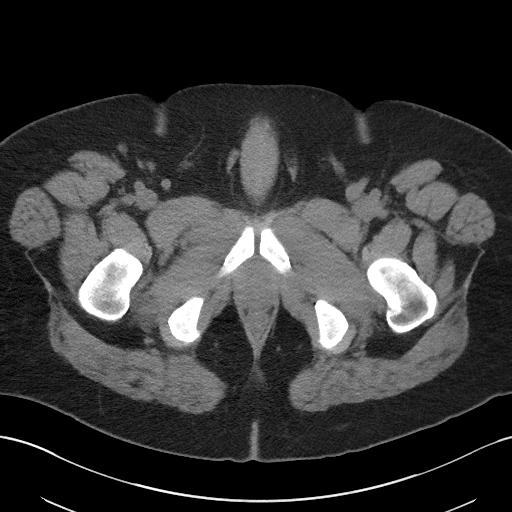
[im 23/106  soft-tissue]
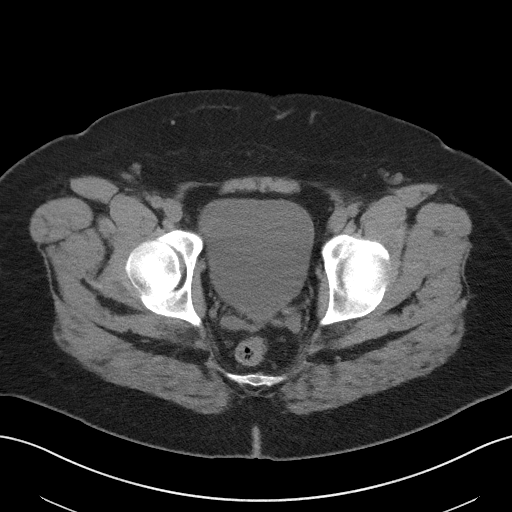
[im 28/106  soft-tissue]
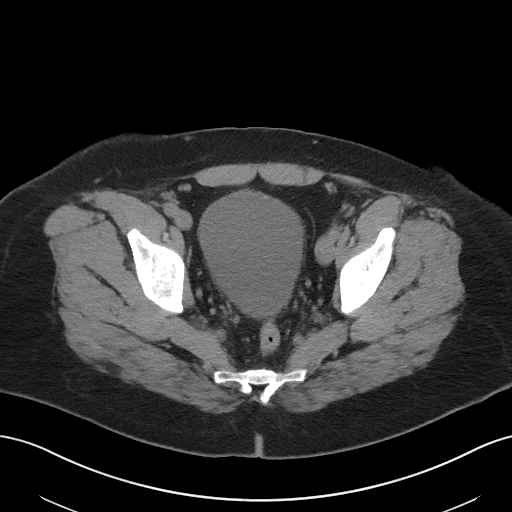
[im 37/106  soft-tissue]
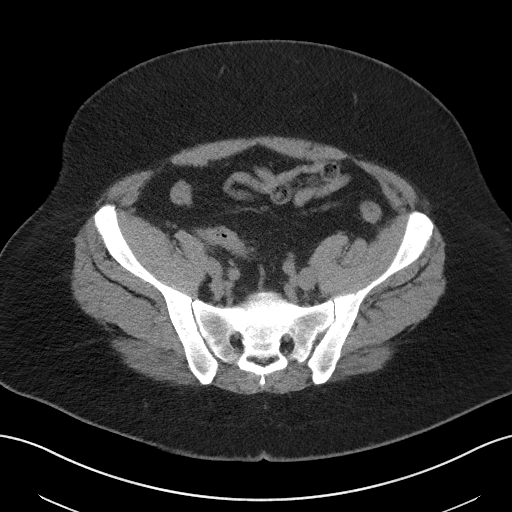
[im 46/106  soft-tissue]
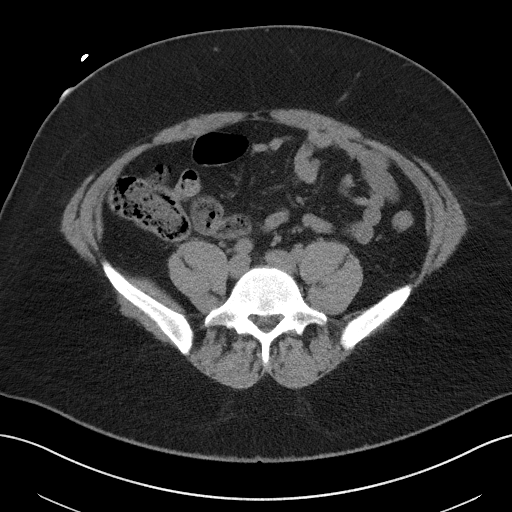
[im 55/106  soft-tissue]
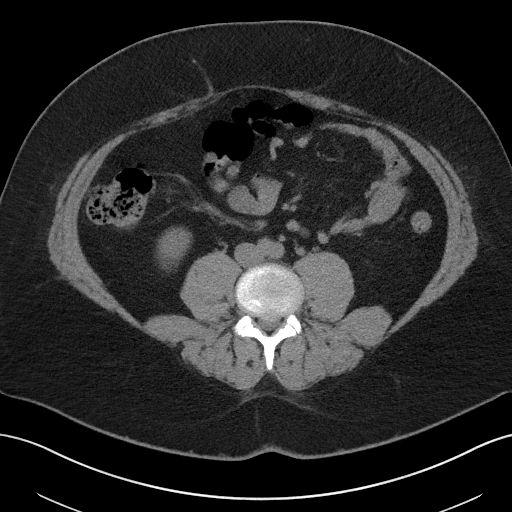
[im 60/106  soft-tissue]
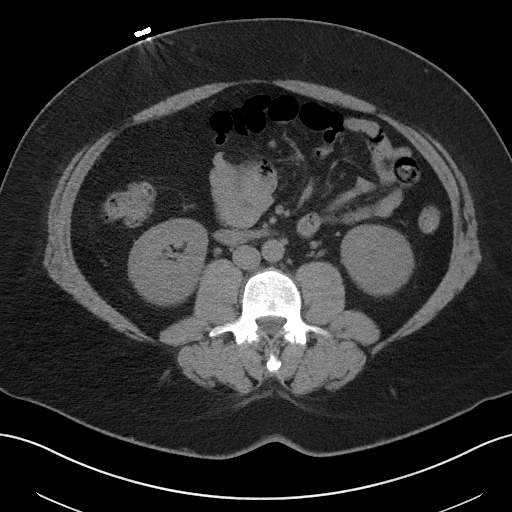
[im 69/106  soft-tissue]
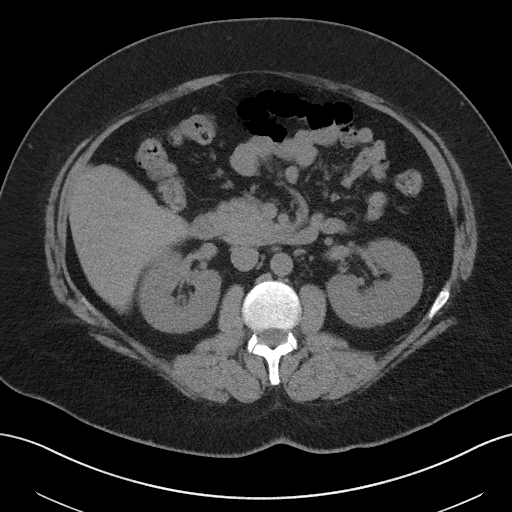
[im 69/106  bone]
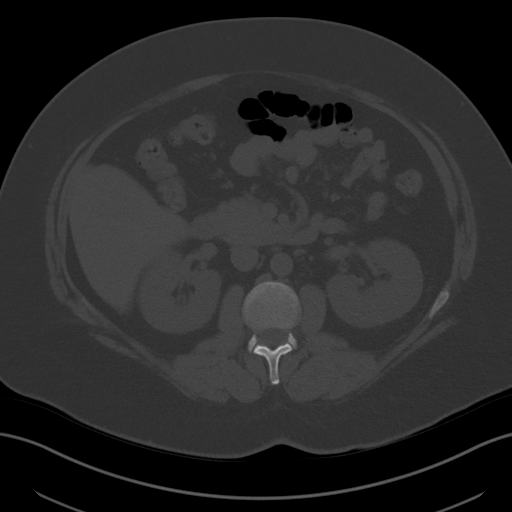
[im 78/106  soft-tissue]
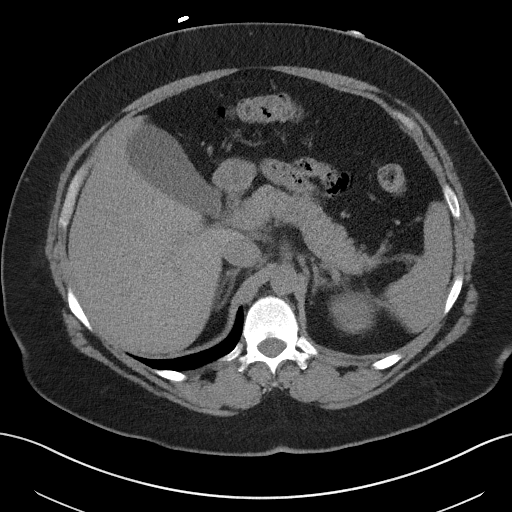
[im 83/106  soft-tissue]
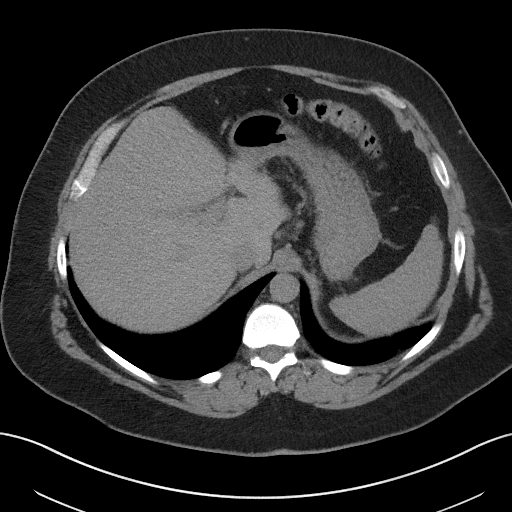
[im 92/106  soft-tissue]
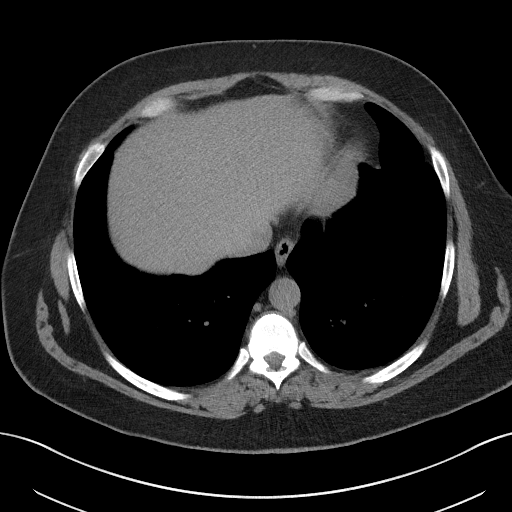
[im 101/106  soft-tissue]
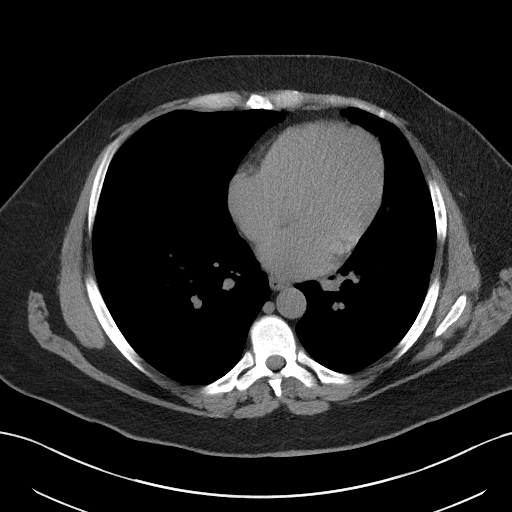

[Series 5: coronal st · coronal · 0.75mm/px · 3 of 112 slices shown]
[im 38/112  soft-tissue]
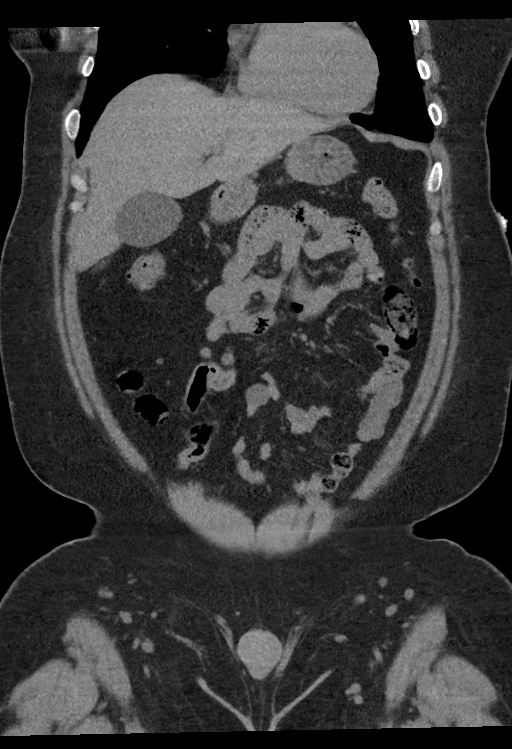
[im 50/112  soft-tissue]
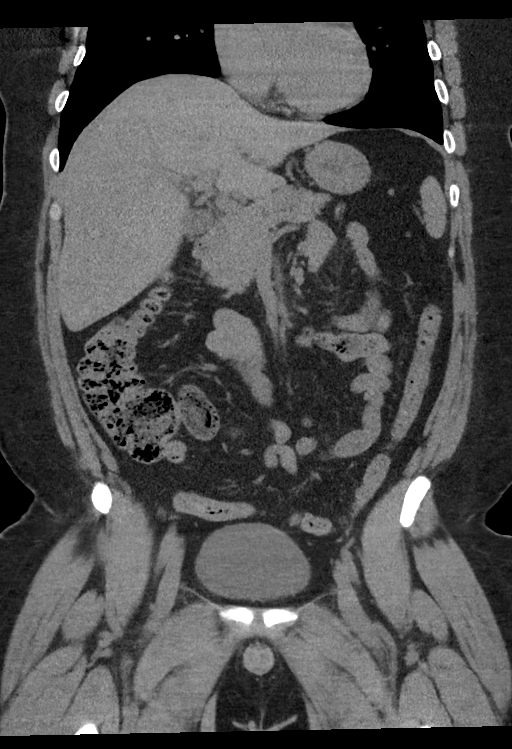
[im 62/112  soft-tissue]
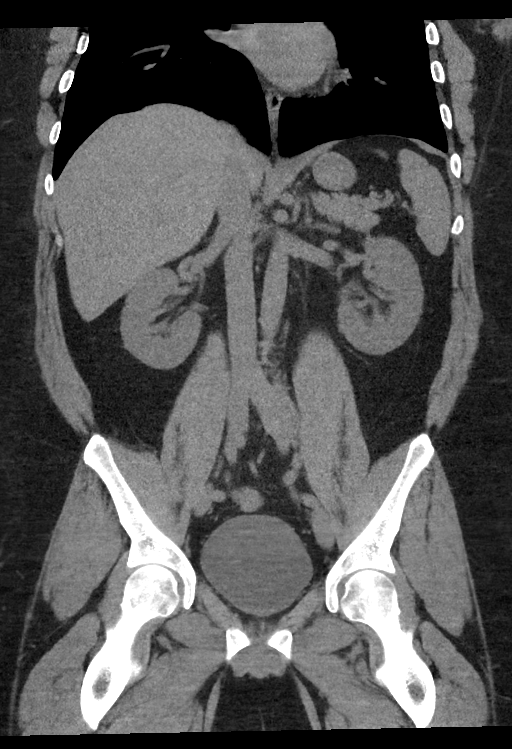

[16 of 46 positions shown; findings below may reference images not displayed]

FINDINGS: Lower chest: No acute findings.

Hepatobiliary: No mass visualized on this unenhanced exam.
Gallbladder is unremarkable. No evidence of biliary ductal
dilatation.

Pancreas: No mass or inflammatory process visualized on this
unenhanced exam.

Spleen:  Within normal limits in size.

Adrenals/Urinary tract: No evidence of urolithiasis or
hydronephrosis. Unremarkable unopacified urinary bladder.

Stomach/Bowel: No evidence of obstruction, inflammatory process, or
abnormal fluid collections.

Vascular/Lymphatic: No pathologically enlarged lymph nodes
identified. No evidence of abdominal aortic aneurysm.

Reproductive:  No mass or other significant abnormality.

Other:  None.

Musculoskeletal:  No suspicious bone lesions identified.
IMPRESSION: Negative.  No acute findings or other significant abnormality.

## 2022-07-04 ENCOUNTER — Ambulatory Visit
Admission: EM | Admit: 2022-07-04 | Discharge: 2022-07-04 | Disposition: A | Discharge status: Home / Self Care | Payer: Self-pay | Attending: Emergency Medicine | Admitting: Emergency Medicine

## 2022-07-04 DIAGNOSIS — H6693 Otitis media, unspecified, bilateral: Secondary | ICD-10-CM

## 2022-07-04 MED ORDER — AMOXICILLIN 875 MG PO TABS: 875.0000 mg | ORAL_TABLET | Freq: Two times a day (BID) | ORAL | 0 refills | Status: AC

## 2022-07-04 NOTE — Discharge Instructions (Addendum)
Take the amoxicillin as directed.  Follow up with your primary care provider if your symptoms are not improving.   ° ° °

## 2022-07-04 NOTE — ED Triage Notes (Signed)
Patient to Urgent Care with complaints of right sided ear pain that started yesterday. Reports ear feels clogged. Pain is so severe he was unable to sleep. Denies any know fevers.   Has been taking tylenol/ motrin.

## 2022-07-04 NOTE — ED Provider Notes (Signed)
Jacob Gardner    CSN: [id number] Arrival date & time: 07/04/22  6606      History   Chief Complaint Chief Complaint  Patient presents with   Otalgia    HPI Jacob Gardner is a 46 y.o. male.  Patient presents with right ear pain x 1 day.  The pain became severe overnight.  He has mild pain in his left ear now also.  He denies fever, rash, sore throat, cough, shortness of breath, vomiting, diarrhea, or other symptoms.  Treatment at home with Motrin; last dose at 0400 this morning.  The history is provided by the patient and medical records.    Past Medical History:  Diagnosis Date   Varicose vein of leg     Patient Active Problem List   Diagnosis Date Noted   Pneumonia due to COVID-19 virus 07/10/2019   Acute respiratory disease due to COVID-19 virus 07/08/2019   Hypokalemia 07/08/2019   COVID-19 virus infection 07/08/2019   Acute respiratory failure with hypoxia (HCC) 07/08/2019    History reviewed. No pertinent surgical history.     Home Medications    Prior to Admission medications   Medication Sig Start Date End Date Taking? Authorizing Provider  amoxicillin (AMOXIL) 875 MG tablet Take 1 tablet (875 mg total) by mouth 2 (two) times daily for 10 days. 07/04/22 07/14/22 Yes Mickie Bail, NP  albuterol (VENTOLIN HFA) 108 (90 Base) MCG/ACT inhaler Inhale 2 puffs into the lungs every 6 (six) hours as needed for wheezing or shortness of breath. 07/12/19   Osvaldo Shipper, MD  dexamethasone (DECADRON) 2 MG tablet Take 2 tablets once daily for 3 days, then 1 tablet once daily for 3 days, then STOP. 07/12/19   Osvaldo Shipper, MD  famotidine (PEPCID) 20 MG tablet Take 1 tablet (20 mg total) by mouth 2 (two) times daily. 01/17/21   Dionne Bucy, MD    Family History Family History  Problem Relation Age of Onset   Diabetes Mellitus II Mother     Social History Social History   Tobacco Use   Smoking status: Never   Smokeless tobacco: Current  Substance  Use Topics   Alcohol use: Never   Drug use: Never     Allergies   Patient has no known allergies.   Review of Systems Review of Systems  Constitutional:  Negative for chills and fever.  HENT:  Positive for ear pain. Negative for ear discharge and sore throat.   Respiratory:  Negative for cough and shortness of breath.   Cardiovascular:  Negative for chest pain and palpitations.  Gastrointestinal:  Negative for diarrhea and vomiting.  Skin:  Negative for color change and rash.  All other systems reviewed and are negative.    Physical Exam Triage Vital Signs ED Triage Vitals  Enc Vitals Group     BP 07/04/22 0847 137/82     Pulse Rate 07/04/22 0844 90     Resp 07/04/22 0844 18     Temp 07/04/22 0844 98 F (36.7 C)     Temp src --      SpO2 07/04/22 0844 95 %     Weight --      Height 07/04/22 0846 5\' 5"  (1.651 m)     Head Circumference --      Peak Flow --      Pain Score 07/04/22 0843 10     Pain Loc --      Pain Edu? --      Excl.  in GC? --    No data found.  Updated Vital Signs BP 137/82   Pulse 90   Temp 98 F (36.7 C)   Resp 18   Ht 5\' 5"  (1.651 m)   SpO2 95%   BMI 42.04 kg/m   Visual Acuity Right Eye Distance:   Left Eye Distance:   Bilateral Distance:    Right Eye Near:   Left Eye Near:    Bilateral Near:     Physical Exam Vitals and nursing note reviewed.  Constitutional:      General: He is not in acute distress.    Appearance: Normal appearance. He is well-developed. He is not ill-appearing.  HENT:     Head: Normocephalic and atraumatic.     Right Ear: Tympanic membrane is erythematous.     Left Ear: Tympanic membrane is erythematous.     Ears:     Comments: R>L erythematous TMs.      Nose: Nose normal.     Mouth/Throat:     Mouth: Mucous membranes are moist.     Pharynx: Oropharynx is clear.  Cardiovascular:     Rate and Rhythm: Normal rate and regular rhythm.  Pulmonary:     Effort: Pulmonary effort is normal. No respiratory  distress.     Breath sounds: Normal breath sounds.  Musculoskeletal:     Cervical back: Neck supple.  Skin:    General: Skin is warm and dry.  Neurological:     Mental Status: He is alert.  Psychiatric:        Mood and Affect: Mood normal.        Behavior: Behavior normal.      UC Treatments / Results  Labs (all labs ordered are listed, but only abnormal results are displayed) Labs Reviewed - No data to display  EKG   Radiology No results found.  Procedures Procedures (including critical care time)  Medications Ordered in UC Medications - No data to display  Initial Impression / Assessment and Plan / UC Course  I have reviewed the triage vital signs and the nursing notes.  Pertinent labs & imaging results that were available during my care of the patient were reviewed by me and considered in my medical decision making (see chart for details).    Bilateral otitis media, R>L.  Treating with amoxicillin.  Tylenol or ibuprofen as needed.  Instructed patient to follow up with his PCP if his symptoms are not improving.  He agrees to plan of care.    Final Clinical Impressions(s) / UC Diagnoses   Final diagnoses:  Bilateral otitis media, unspecified otitis media type     Discharge Instructions      Take the amoxicillin as directed.  Follow up with your primary care provider if your symptoms are not improving.        ED Prescriptions     Medication Sig Dispense Auth. Provider   amoxicillin (AMOXIL) 875 MG tablet Take 1 tablet (875 mg total) by mouth 2 (two) times daily for 10 days. 20 tablet , NP      PDMP not reviewed this encounter.   Mickie Bail, NP 07/04/22 [phone]

## 2023-04-07 NOTE — Progress Notes (Signed)
This encounter was created in error - please disregard.
